# Patient Record
Sex: Female | Born: 1960 | Race: Black or African American | Hispanic: No | Marital: Single | Smoking: Former smoker
Health system: Southern US, Community
[De-identification: ages and names within clinical notes are randomized; demographics above are authoritative.]

## PROBLEM LIST (undated history)

## (undated) DIAGNOSIS — R011 Cardiac murmur, unspecified: Secondary | ICD-10-CM

## (undated) DIAGNOSIS — I00 Rheumatic fever without heart involvement: Secondary | ICD-10-CM

---

## 1998-01-29 ENCOUNTER — Emergency Department (HOSPITAL_COMMUNITY): Admission: EM | Admit: 1998-01-29 | Discharge: 1998-01-29 | Payer: Self-pay

## 1998-10-18 ENCOUNTER — Emergency Department (HOSPITAL_COMMUNITY): Admission: EM | Admit: 1998-10-18 | Discharge: 1998-10-18 | Payer: Self-pay | Admitting: Emergency Medicine

## 1998-10-18 ENCOUNTER — Encounter: Payer: Self-pay | Admitting: Emergency Medicine

## 2002-02-13 ENCOUNTER — Emergency Department (HOSPITAL_COMMUNITY): Admission: EM | Admit: 2002-02-13 | Discharge: 2002-02-13 | Payer: Self-pay | Admitting: Emergency Medicine

## 2003-09-29 ENCOUNTER — Emergency Department (HOSPITAL_COMMUNITY): Admission: EM | Admit: 2003-09-29 | Discharge: 2003-09-29 | Payer: Self-pay | Admitting: Emergency Medicine

## 2003-10-08 ENCOUNTER — Emergency Department (HOSPITAL_COMMUNITY): Admission: EM | Admit: 2003-10-08 | Discharge: 2003-10-08 | Payer: Self-pay | Admitting: Emergency Medicine

## 2003-10-25 ENCOUNTER — Encounter: Admission: RE | Admit: 2003-10-25 | Discharge: 2003-11-29 | Payer: Self-pay | Admitting: Family Medicine

## 2008-09-17 ENCOUNTER — Emergency Department (HOSPITAL_COMMUNITY): Admission: EM | Admit: 2008-09-17 | Discharge: 2008-09-18 | Payer: Self-pay | Admitting: Emergency Medicine

## 2011-09-04 ENCOUNTER — Emergency Department (HOSPITAL_COMMUNITY)
Admission: EM | Admit: 2011-09-04 | Discharge: 2011-09-05 | Disposition: A | Discharge status: Home / Self Care | Payer: Self-pay | Attending: Emergency Medicine | Admitting: Emergency Medicine

## 2011-09-04 ENCOUNTER — Encounter (HOSPITAL_COMMUNITY): Payer: Self-pay | Admitting: Emergency Medicine

## 2011-09-04 DIAGNOSIS — R350 Frequency of micturition: Secondary | ICD-10-CM | POA: Insufficient documentation

## 2011-09-04 DIAGNOSIS — Z8619 Personal history of other infectious and parasitic diseases: Secondary | ICD-10-CM | POA: Insufficient documentation

## 2011-09-04 DIAGNOSIS — R109 Unspecified abdominal pain: Secondary | ICD-10-CM | POA: Insufficient documentation

## 2011-09-04 DIAGNOSIS — R011 Cardiac murmur, unspecified: Secondary | ICD-10-CM | POA: Insufficient documentation

## 2011-09-04 DIAGNOSIS — Z842 Family history of other diseases of the genitourinary system: Secondary | ICD-10-CM | POA: Insufficient documentation

## 2011-09-04 HISTORY — DX: Cardiac murmur, unspecified: R01.1

## 2011-09-04 HISTORY — DX: Rheumatic fever without heart involvement: I00

## 2011-09-04 LAB — URINE MICROSCOPIC-ADD ON

## 2011-09-04 LAB — URINALYSIS, ROUTINE W REFLEX MICROSCOPIC
Bilirubin Urine: NEGATIVE
Glucose, UA: NEGATIVE mg/dL
Ketones, ur: NEGATIVE mg/dL
Nitrite: NEGATIVE
Protein, ur: NEGATIVE mg/dL
Specific Gravity, Urine: 1.009 (ref 1.005–1.030)
Urobilinogen, UA: 0.2 mg/dL (ref 0.0–1.0)
pH: 6.5 (ref 5.0–8.0)

## 2011-09-04 NOTE — ED Notes (Signed)
Pt states she is having pain in her left flank area  Pt states pain started suddenly and is a nagging throbbing pain

## 2011-09-04 NOTE — ED Provider Notes (Signed)
History     CSN: [id number]  Arrival date & time 09/04/11  2201   First MD Initiated Contact with Patient 09/04/11 2241      Chief Complaint  Patient presents with  . Flank Pain     HPI  History provided by the patient. Patient is a 51 year old female with no significant past medical history who presents with complaints of acute left flank pain around 7 PM tonight. Symptoms are described as a sharp aching pain without radiation. Pain is moderate to severe at times. Patient gradually has improved and has since resolved completely after arriving to emergency room. Patient does report a history of being on a strict diet that includes drinking a special formulated Tea. patient reports drinking this tea heavily for the past several years. After the onset of her symptoms patient did drink a whole bottle of cranberry juice. She thinks that this helped with her symptoms. Patient has not had any recent dysuria or urinary frequency. She does report having urinary frequency today after drinking all the fluids. Patient denies having any associated fever, chills, sweats, nausea or vomiting. Denies any diarrhea or constipation. She denies any vaginal bleeding or vaginal discharge. Patient denies having similar symptoms previously. No history of kidney stones.    Past Medical History  Diagnosis Date  . Heart murmur   . Rheumatic fever     History reviewed. No pertinent past surgical history.  Family History  Problem Relation Age of Onset  . Kidney disease Mother     History  Substance Use Topics  . Smoking status: Never Smoker   . Smokeless tobacco: Not on file  . Alcohol Use: No    OB History    Grav Para Term Preterm Abortions TAB SAB Ect Mult Living                  Review of Systems  Constitutional: Negative for fever and chills.  Respiratory: Negative for cough and shortness of breath.   Cardiovascular: Negative for chest pain.  Gastrointestinal: Negative for nausea, vomiting,  abdominal pain, diarrhea and constipation.  Genitourinary: Positive for frequency and flank pain. Negative for dysuria, hematuria, vaginal bleeding and vaginal discharge.  All other systems reviewed and are negative.    Allergies  Review of patient's allergies indicates no known allergies.  Home Medications  No current outpatient prescriptions on file.  BP 125/85  Pulse 94  Temp(Src) 97.8 F (36.6 C) (Oral)  Resp 20  SpO2 100%  LMP 08/28/2011  Physical Exam  Nursing note and vitals reviewed. Constitutional: She is oriented to person, place, and time. She appears well-developed and well-nourished. No distress.  HENT:  Head: Normocephalic.  Cardiovascular: Normal rate and regular rhythm.   Pulmonary/Chest: Effort normal and breath sounds normal.  Abdominal: Soft. There is no rigidity, no rebound, no guarding, no CVA tenderness and no tenderness at McBurney's point.  Neurological: She is alert and oriented to person, place, and time.  Skin: Skin is warm and dry. No rash noted.  Psychiatric: She has a normal mood and affect. Her behavior is normal.    ED Course  Procedures   Results for orders placed during the hospital encounter of 09/04/11  URINALYSIS, ROUTINE W REFLEX MICROSCOPIC      Component Value Range   Color, Urine YELLOW  YELLOW    APPearance CLEAR  CLEAR    Specific Gravity, Urine 1.009  1.005 - 1.030    pH 6.5  5.0 - 8.0  Glucose, UA NEGATIVE  NEGATIVE (mg/dL)   Hgb urine dipstick LARGE (*) NEGATIVE    Bilirubin Urine NEGATIVE  NEGATIVE    Ketones, ur NEGATIVE  NEGATIVE (mg/dL)   Protein, ur NEGATIVE  NEGATIVE (mg/dL)   Urobilinogen, UA 0.2  0.0 - 1.0 (mg/dL)   Nitrite NEGATIVE  NEGATIVE    Leukocytes, UA SMALL (*) NEGATIVE   URINE MICROSCOPIC-ADD ON      Component Value Range   Squamous Epithelial / LPF RARE  RARE    WBC, UA 0-2  <3 (WBC/hpf)   RBC / HPF 0-2  <3 (RBC/hpf)   Bacteria, UA RARE  RARE         1. Flank pain       MDM  10:50PM  patient seen and evaluated. Patient in no acute distress. Patient currently without symptoms.  Patient reports that she is very afraid of needles and would not like to have a needle tests if possible.  Patient continues to be symptom-free. Urine without any significant findings. Dipstick does show large hemoglobin but very few on microscopic. At this time patient will be discharged home with continued observation of symptoms.      Angus Seller, [place] 09/05/11 [phone]

## 2011-09-05 ENCOUNTER — Encounter (HOSPITAL_COMMUNITY): Payer: Self-pay | Admitting: Emergency Medicine

## 2011-09-05 ENCOUNTER — Emergency Department (HOSPITAL_COMMUNITY)
Admission: EM | Admit: 2011-09-05 | Discharge: 2011-09-05 | Disposition: A | Discharge status: Home / Self Care | Payer: Self-pay | Attending: Emergency Medicine | Admitting: Emergency Medicine

## 2011-09-05 ENCOUNTER — Emergency Department (HOSPITAL_COMMUNITY): Payer: Self-pay

## 2011-09-05 DIAGNOSIS — E669 Obesity, unspecified: Secondary | ICD-10-CM | POA: Insufficient documentation

## 2011-09-05 DIAGNOSIS — R10819 Abdominal tenderness, unspecified site: Secondary | ICD-10-CM | POA: Insufficient documentation

## 2011-09-05 DIAGNOSIS — R3129 Other microscopic hematuria: Secondary | ICD-10-CM | POA: Insufficient documentation

## 2011-09-05 DIAGNOSIS — R109 Unspecified abdominal pain: Secondary | ICD-10-CM | POA: Insufficient documentation

## 2011-09-05 LAB — CBC
HCT: 33.7 % — ABNORMAL LOW (ref 36.0–46.0)
Hemoglobin: 10.9 g/dL — ABNORMAL LOW (ref 12.0–15.0)
MCH: 29.2 pg (ref 26.0–34.0)
MCHC: 32.3 g/dL (ref 30.0–36.0)
MCV: 90.3 fL (ref 78.0–100.0)
Platelets: 239 10*3/uL (ref 150–400)
RBC: 3.73 MIL/uL — ABNORMAL LOW (ref 3.87–5.11)
RDW: 14.1 % (ref 11.5–15.5)
WBC: 4.6 10*3/uL (ref 4.0–10.5)

## 2011-09-05 LAB — BASIC METABOLIC PANEL
BUN: 11 mg/dL (ref 6–23)
Chloride: 104 mEq/L (ref 96–112)
Creatinine, Ser: 0.63 mg/dL (ref 0.50–1.10)
GFR calc non Af Amer: >90 mL/min (ref >90)
Glucose, Bld: 104 mg/dL — ABNORMAL HIGH (ref 70–99)
Potassium: 4.5 mEq/L (ref 3.5–5.1)

## 2011-09-05 LAB — DIFFERENTIAL
Basophils Relative: 0 % (ref 0–1)
Eosinophils Absolute: 0 10*3/uL (ref 0.0–0.7)
Lymphs Abs: 1.2 10*3/uL (ref 0.7–4.0)
Monocytes Absolute: 0.4 10*3/uL (ref 0.1–1.0)
Monocytes Relative: 8 % (ref 3–12)
Neutrophils Relative %: 64 % (ref 43–77)

## 2011-09-05 MED ORDER — HYDROCODONE-ACETAMINOPHEN 5-325 MG PO TABS
1.0000 | ORAL_TABLET | Freq: Once | ORAL | Status: AC
  Administered 2011-09-05: 1 via ORAL
  Filled 2011-09-05: qty 1

## 2011-09-05 MED ORDER — HYDROCODONE-ACETAMINOPHEN 5-325 MG PO TABS: 1.0000 | ORAL_TABLET | ORAL | Status: AC | PRN

## 2011-09-05 NOTE — ED Provider Notes (Signed)
Medical screening examination/treatment/procedure(s) were performed by non-physician practitioner and as supervising physician I was immediately available for consultation/collaboration.  Flint Melter, MD 09/05/11 0800

## 2011-09-05 NOTE — Discharge Instructions (Signed)
You were seen again for your symptoms of left flank pain. Your lab tests and CAT scan today have not shown any signs for concerning or emergent causes to explain your symptoms.  There were no signs for kidney stones. At this time your providers feel you're able to return home and followup with a primary care provider for continued evaluation and treatment of your symptoms.   Flank Pain Flank pain refers to pain that is located on the side of the body between the upper abdomen and the back. It can be caused by many things. CAUSES  Some of the more common causes of flank pain include:  Muscle strain.   Muscle spasms.   A disease of your spine (vertebral disk disease).   A lung infection (pneumonia).   Fluid around your lungs (pulmonary edema).   A kidney infection.   Kidney stones.   A very painful skin rash on only one side of your body (shingles).   Gallbladder disease.  DIAGNOSIS  Blood tests, urine tests, and X-rays may help your caregiver determine what is wrong. TREATMENT  The treatment of pain depends on the cause. Your caregiver will determine what treatment will work best for you. HOME CARE INSTRUCTIONS   Home care will depend on the cause of your pain.   Some medications may help relieve the pain. Take medication for relief of pain as directed by your caregiver.   Tell your caregiver about any changes in your pain.   Follow up with your caregiver.  SEEK IMMEDIATE MEDICAL CARE IF:   Your pain is not controlled with medication.   The pain increases.   You have abdominal pain.   You have shortness of breath.   You have persistent nausea or vomiting.   You have swelling in your abdomen.   You feel faint or pass out.   You have a temperature by mouth above 102 F (38.9 C), not controlled by medicine.  MAKE SURE YOU:   Understand these instructions.   Will watch your condition.   Will get help right away if you are not doing well or get worse.    Document Released: 08/08/2005 Document Revised: 06/06/2011 Document Reviewed: 12/02/2009 [facility] Patient Information 2012 [place], [place].   RESOURCE GUIDE  Dental Problems  Patients with Medicaid: [facility] [phone] [address]                                           [phone] [address] Phone:  [phone]                                                  Phone:  [phone]  If unable to pay or uninsured, contact:  [facility] or [facility]. to become qualified for the adult dental clinic.  Chronic Pain Problems Contact [facility]  [phone] Patients need to be referred by their primary care doctor.  Insufficient Money for Medicine Contact United Way:  call "211" or [facility] [phone].  No Primary Care Doctor Call [facility]  [phone] Other agencies that provide  inexpensive medical care    [facility]  [phone]    [facility]  [phone]    [facility]  [phone]    [facility]  [phone]    [facility]  [phone]    [facility]  [phone]  Psychological Services [facility]  [phone] [facility]  [phone] [facility] Mental Health   [phone] (emergency services [phone])  Substance Abuse Resources [facility] and Drug Services  [phone] [facility] [phone] [facility] [place] [phone] Floydene Flock [phone] Residential & Outpatient Substance Abuse Program  [phone]  Abuse/Neglect [facility] Child Abuse Hotline 470-324-7561 [facility] Child Abuse Hotline [phone] (After Hours)  Emergency Shelter [facility] Ministries [phone]  Maternity Homes Room at the [place] [facility] [place] [phone] [facility] [phone]  MRSA Hotline #:   8017776582    [facility] Resources  [facility] [place]     United Way                          [facility] Dept. [address] [place]                       [address]      [address]  Blondell Reveal Phone:  [phone]                                   Phone:  [phone]                 Phone:  [phone]  [facility] Mental Health Phone:  [phone]  [facility] Child Abuse Hotline 5622386918 [phone] (After Hours)

## 2011-09-05 NOTE — ED Provider Notes (Signed)
History     CSN: [id number]  Arrival date & time 09/05/11  4098   First MD Initiated Contact with Patient 09/05/11 [phone]      Chief Complaint  Patient presents with  . Flank Pain     HPI  History provided by the patient. Patient is a 51 year old female with no significant past medical history who returns for reexamination of left flank pain and symptoms. Patient was seen last evening for similar complaints but was asymptomatic during emergency room visit. She did have a urine with hematuria. Patient currently states that left flank pains returned early this morning. Pain is a sharp stabbing pain in left flank. Pain is persistent at this time and continues. It is not associated with any other symptoms. She denies fever, chills, sweats, nausea or vomiting. Patient denies dysuria, hematuria or urinary frequency. She denies any vaginal bleeding or vaginal discharge. Symptoms are described as moderate to severe.    Past Medical History  Diagnosis Date  . Heart murmur   . Rheumatic fever     History reviewed. No pertinent past surgical history.  Family History  Problem Relation Age of Onset  . Kidney disease Mother     History  Substance Use Topics  . Smoking status: Never Smoker   . Smokeless tobacco: Not on file  . Alcohol Use: No    OB History    Grav Para Term Preterm Abortions TAB SAB Ect Mult Living                  Review of Systems  Constitutional: Negative for fever and chills.  Gastrointestinal: Negative for nausea, vomiting, abdominal pain, diarrhea and constipation.  Genitourinary: Positive for flank pain. Negative for dysuria, hematuria, vaginal bleeding and vaginal discharge.  All other systems reviewed and are negative.    Allergies  Review of patient's allergies indicates no known allergies.  Home Medications  No current outpatient prescriptions on file.  BP 103/63  Pulse 78  Temp(Src) 98.3 F (36.8 C) (Oral)  Resp 18  SpO2 100%  LMP  08/28/2011  Physical Exam  Nursing note and vitals reviewed. Constitutional: She is oriented to person, place, and time. She appears well-developed and well-nourished. No distress.  HENT:  Head: Normocephalic and atraumatic.  Cardiovascular: Normal rate and regular rhythm.   Pulmonary/Chest: Effort normal and breath sounds normal. No respiratory distress. She has no wheezes. She has no rales.  Abdominal: Soft. She exhibits no distension. There is no tenderness. There is no rebound and no guarding.       Left CVA tenderness. Obese  Neurological: She is alert and oriented to person, place, and time.  Skin: Skin is warm and dry. No rash noted.  Psychiatric: She has a normal mood and affect. Her behavior is normal.    ED Course  Procedures   Results for orders placed during the hospital encounter of 09/05/11  CBC      Component Value Range   WBC 4.6  4.0 - 10.5 (K/uL)   RBC 3.73 (*) 3.87 - 5.11 (MIL/uL)   Hemoglobin 10.9 (*) 12.0 - 15.0 (g/dL)   HCT 47.8 (*) 29.5 - 46.0 (%)   MCV 90.3  78.0 - 100.0 (fL)   MCH 29.2  26.0 - 34.0 (pg)   MCHC 32.3  30.0 - 36.0 (g/dL)   RDW 62.1  30.8 - 65.7 (%)   Platelets 239  150 - 400 (K/uL)  DIFFERENTIAL      Component Value Range  Neutrophils Relative 64  43 - 77 (%)   Neutro Abs 2.9  1.7 - 7.7 (K/uL)   Lymphocytes Relative 26  12 - 46 (%)   Lymphs Abs 1.2  0.7 - 4.0 (K/uL)   Monocytes Relative 8  3 - 12 (%)   Monocytes Absolute 0.4  0.1 - 1.0 (K/uL)   Eosinophils Relative 1  0 - 5 (%)   Eosinophils Absolute 0.0  0.0 - 0.7 (K/uL)   Basophils Relative 0  0 - 1 (%)   Basophils Absolute 0.0  0.0 - 0.1 (K/uL)      Ct Abdomen Pelvis Wo Contrast  09/05/2011  *RADIOLOGY REPORT*  Clinical Data: Left flank pain, microscopic hematuria, leukouria  CT ABDOMEN AND PELVIS WITHOUT CONTRAST  Technique:  Multidetector CT imaging of the abdomen and pelvis was performed following the standard protocol without intravenous contrast. Sagittal and coronal MPR  images reconstructed from axial data set.  Comparison: None  Findings: Lung bases clear. No urinary tract calcification, hydronephrosis or ureteral dilatation. Within limits of a nonenhanced exam, no focal abnormalities of the liver, spleen, pancreas, kidneys, or adrenal glands identified. Supraumbilical ventral hernia, fascial defect 1.9 x 1.3 cm, hernia sac containing fat. Additional tiny umbilical hernia containing fat. Normal appendix.  Small amount of low attenuation nonspecific free pelvic fluid. Uterus prominent size. Bladder partially distended, unremarkable. Stomach and bowel loops grossly normal appearance. No mass, adenopathy, or free air. No acute osseous findings.  IMPRESSION: Supraumbilical and tiny umbilical hernias containing fat. Small amount nonspecific low attenuation free pelvic fluid. No other intrapelvic or intra abdominal abnormalities identified.  Original Report Authenticated By: Lollie Marrow, M.D.     1. Flank pain       MDM  4:50 AM patient seen and evaluated. Patient in no acute distress. Patient was seen by me last evening for similar complaint. Patient was asymptomatic during her earlier visit and following her urine specimen patient did not wish to have further testing at that time to see if symptoms returned or resolved completely.  Patient now returns with more persistent and intense left flank pains. Although she does not like needles she has agreed to have her blood drawn and tested. We will also perform a CT scan to rule out left kidney stone.        Angus Seller, [place] 09/05/11 [phone]

## 2011-09-05 NOTE — Discharge Instructions (Signed)
Your urine test today has not shown any concerning signs to explain your symptoms. At this time your providers feel you may return home and continue to monitor your symptoms. You should call your primary care provider for close followup appointment. If you develop return of your symptoms or any new concerning symptoms please return to emergency room for additional evaluation and treatment.   Flank Pain Flank pain refers to pain that is located on the side of the body between the upper abdomen and the back. It can be caused by many things. CAUSES  Some of the more common causes of flank pain include:  Muscle strain.   Muscle spasms.   A disease of your spine (vertebral disk disease).   A lung infection (pneumonia).   Fluid around your lungs (pulmonary edema).   A kidney infection.   Kidney stones.   A very painful skin rash on only one side of your body (shingles).   Gallbladder disease.  DIAGNOSIS  Blood tests, urine tests, and X-rays may help your caregiver determine what is wrong. TREATMENT  The treatment of pain depends on the cause. Your caregiver will determine what treatment will work best for you. HOME CARE INSTRUCTIONS   Home care will depend on the cause of your pain.   Some medications may help relieve the pain. Take medication for relief of pain as directed by your caregiver.   Tell your caregiver about any changes in your pain.   Follow up with your caregiver.  SEEK IMMEDIATE MEDICAL CARE IF:   Your pain is not controlled with medication.   The pain increases.   You have abdominal pain.   You have shortness of breath.   You have persistent nausea or vomiting.   You have swelling in your abdomen.   You feel faint or pass out.   You have a temperature by mouth above 102 F (38.9 C), not controlled by medicine.  MAKE SURE YOU:   Understand these instructions.   Will watch your condition.   Will get help right away if you are not doing well or get  worse.  Document Released: 08/08/2005 Document Revised: 06/06/2011 Document Reviewed: 12/02/2009 [facility] Patient Information 2012 [place], [place].    RESOURCE GUIDE  Dental Problems  Patients with Medicaid: [facility] [phone] [address]                                           [phone] [address] Phone:  [phone]                                                  Phone:  [phone]  If unable to pay or uninsured, contact:  [facility] or [facility]. to become qualified for the adult dental clinic.  Chronic Pain Problems Contact [facility]  [phone] Patients need to be referred by their primary care doctor.  Insufficient Money for Medicine Contact United Way:  call "211" or [facility] [phone].  No Primary Care Doctor Call [facility]  [phone] Other agencies that provide inexpensive medical care  [facility]  [phone]    [facility] Internal Medicine  [phone]    [facility]  [phone]    [facility]  [phone]    [facility]anned Parenthood  7376146774    [facility] Child Clinic  [phone]  Psychological Services [facility] Behavioral Health  [phone] [facility]  [phone] [facility] Mental Health   [phone] (emergency services [phone]) [phone])  Substance Abuse Resources Alcohol and Drug Services  [phone] [facility] 843-873-7016 [facility] [place] [phone] Floydene Flock [phone] Residential & Outpatient Substance Abuse Program  762-295-6795  Abuse/Neglect [facility] Child Abuse Hotline 860-631-9862 [facility] Child Abuse Hotline (614) 639-5835 (After Hours)  Emergency Shelter [facility] Ministries (607)072-2851  Maternity Homes Room at the [place] [facility] [place] 615-233-2059 [facility] (786)133-8230  MRSA Hotline #:   (615)108-0922    [facility] Resources  [facility]  [place]     United Way                          [facility] Dept. [address] [place]                       [address]      [address]  Blondell Reveal Phone:  [phone]                                   Phone:  [phone]                 Phone:  [phone]  [facility] Mental Health Phone:  [phone]  [facility] Child Abuse Hotline 661-187-3356 2405121796 (After Hours)

## 2011-09-05 NOTE — ED Provider Notes (Signed)
Medical screening examination/treatment/procedure(s) were performed by non-physician practitioner and as supervising physician I was immediately available for consultation/collaboration.   Glynn Octave, MD 09/05/11 1444

## 2011-09-05 NOTE — ED Notes (Signed)
Pt presents with left flank pain  Pt was seen here earlier for same  Pt states once she got home and went to bed the pain returned and has not let up  Pt had a UA done when she was here earlier

## 2013-09-21 ENCOUNTER — Emergency Department (HOSPITAL_COMMUNITY): Payer: Self-pay

## 2013-09-21 ENCOUNTER — Encounter (HOSPITAL_COMMUNITY): Payer: Self-pay | Admitting: Emergency Medicine

## 2013-09-21 ENCOUNTER — Emergency Department (HOSPITAL_COMMUNITY)
Admission: EM | Admit: 2013-09-21 | Discharge: 2013-09-21 | Disposition: A | Discharge status: Home / Self Care | Payer: Self-pay | Attending: Emergency Medicine | Admitting: Emergency Medicine

## 2013-09-21 DIAGNOSIS — L02419 Cutaneous abscess of limb, unspecified: Secondary | ICD-10-CM | POA: Insufficient documentation

## 2013-09-21 DIAGNOSIS — Y939 Activity, unspecified: Secondary | ICD-10-CM | POA: Insufficient documentation

## 2013-09-21 DIAGNOSIS — R079 Chest pain, unspecified: Secondary | ICD-10-CM | POA: Insufficient documentation

## 2013-09-21 DIAGNOSIS — Z8679 Personal history of other diseases of the circulatory system: Secondary | ICD-10-CM | POA: Insufficient documentation

## 2013-09-21 DIAGNOSIS — R0602 Shortness of breath: Secondary | ICD-10-CM | POA: Insufficient documentation

## 2013-09-21 DIAGNOSIS — Z7982 Long term (current) use of aspirin: Secondary | ICD-10-CM | POA: Insufficient documentation

## 2013-09-21 DIAGNOSIS — L03119 Cellulitis of unspecified part of limb: Principal | ICD-10-CM

## 2013-09-21 DIAGNOSIS — Y929 Unspecified place or not applicable: Secondary | ICD-10-CM | POA: Insufficient documentation

## 2013-09-21 DIAGNOSIS — R002 Palpitations: Secondary | ICD-10-CM | POA: Insufficient documentation

## 2013-09-21 DIAGNOSIS — L0390 Cellulitis, unspecified: Secondary | ICD-10-CM

## 2013-09-21 DIAGNOSIS — W57XXXA Bitten or stung by nonvenomous insect and other nonvenomous arthropods, initial encounter: Secondary | ICD-10-CM

## 2013-09-21 DIAGNOSIS — R011 Cardiac murmur, unspecified: Secondary | ICD-10-CM | POA: Insufficient documentation

## 2013-09-21 LAB — CBC
HCT: 37.5 % (ref 36.0–46.0)
Hemoglobin: 12.8 g/dL (ref 12.0–15.0)
MCH: 31.2 pg (ref 26.0–34.0)
MCHC: 34.1 g/dL (ref 30.0–36.0)
MCV: 91.5 fL (ref 78.0–100.0)
PLATELETS: 214 10*3/uL (ref 150–400)
RBC: 4.1 MIL/uL (ref 3.87–5.11)
RDW: 13.8 % (ref 11.5–15.5)
WBC: 3.3 10*3/uL — AB (ref 4.0–10.5)

## 2013-09-21 LAB — I-STAT TROPONIN, ED: TROPONIN I, POC: 0 ng/mL (ref 0.00–0.08)

## 2013-09-21 LAB — BASIC METABOLIC PANEL
BUN: 16 mg/dL (ref 6–23)
CHLORIDE: 101 meq/L (ref 96–112)
CO2: 26 meq/L (ref 19–32)
CREATININE: 0.58 mg/dL (ref 0.50–1.10)
Calcium: 9.6 mg/dL (ref 8.4–10.5)
GFR calc Af Amer: >90 mL/min (ref >90)
GFR calc non Af Amer: >90 mL/min (ref >90)
Glucose, Bld: 83 mg/dL (ref 70–99)
POTASSIUM: 4.5 meq/L (ref 3.7–5.3)
SODIUM: 139 meq/L (ref 137–147)

## 2013-09-21 LAB — PRO B NATRIURETIC PEPTIDE: PRO B NATRI PEPTIDE: 134.3 pg/mL — AB (ref 0–125)

## 2013-09-21 LAB — D-DIMER, QUANTITATIVE (NOT AT ARMC): D DIMER QUANT: 1.28 ug{FEU}/mL — AB (ref 0.00–0.48)

## 2013-09-21 MED ORDER — CEPHALEXIN 500 MG PO CAPS: 500.0000 mg | ORAL_CAPSULE | Freq: Four times a day (QID) | ORAL | Status: DC

## 2013-09-21 MED ORDER — CEPHALEXIN 250 MG PO CAPS
500.0000 mg | ORAL_CAPSULE | Freq: Once | ORAL | Status: AC
Start: 2013-09-21 — End: 2013-09-21
  Administered 2013-09-21: 500 mg via ORAL
  Filled 2013-09-21: qty 2

## 2013-09-21 NOTE — Discharge Instructions (Signed)
Cellulitis Cellulitis is an infection of the skin and the tissue beneath it. The infected area is usually red and tender. Cellulitis occurs most often in the arms and lower legs.  CAUSES  Cellulitis is caused by bacteria that enter the skin through cracks or cuts in the skin. The most common types of bacteria that cause cellulitis are Staphylococcus and Streptococcus. SYMPTOMS   Redness and warmth.  Swelling.  Tenderness or pain.  Fever. DIAGNOSIS  Your caregiver can usually determine what is wrong based on a physical exam. Blood tests may also be done. TREATMENT  Treatment usually involves taking an antibiotic medicine. HOME CARE INSTRUCTIONS   Take your antibiotics as directed. Finish them even if you start to feel better.  Keep the infected arm or leg elevated to reduce swelling.  Apply a warm cloth to the affected area up to 4 times per day to relieve pain.  Only take over-the-counter or prescription medicines for pain, discomfort, or fever as directed by your caregiver.  Keep all follow-up appointments as directed by your caregiver. SEEK MEDICAL CARE IF:   You notice red streaks coming from the infected area.  Your red area gets larger or turns dark in color.  Your bone or joint underneath the infected area becomes painful after the skin has healed.  Your infection returns in the same area or another area.  You notice a swollen bump in the infected area.  You develop new symptoms. SEEK IMMEDIATE MEDICAL CARE IF:   You have a fever.  You feel very sleepy.  You develop vomiting or diarrhea.  You have a general ill feeling (malaise) with muscle aches and pains. MAKE SURE YOU:   Understand these instructions.  Will watch your condition.  Will get help right away if you are not doing well or get worse. Document Released: 03/27/2005 Document Revised: 12/17/2011 Document Reviewed: 09/02/2011 [employer] Patient Information 2014 [employer].  

## 2013-09-21 NOTE — ED Notes (Signed)
Pt concerned about having to be stuck again, sts if the scan is not necessary then she doesn't want to IV or the scan. Spoke with Dr. Fayrene FearingJames about pt's concerns and he will be in shortly to speak with the pt.

## 2013-09-21 NOTE — ED Notes (Signed)
Pt agreed to have IV started for CT Angio Chest.  IV team paged and aware of need for 20g IV.

## 2013-09-21 NOTE — ED Provider Notes (Signed)
CSN: [id number]     Arrival date & time 09/21/13  [id number] History   First MD Initiated Contact with Patient 09/21/13 [phone]     Chief Complaint  Patient presents with  . Shortness of Breath  . Leg Pain      HPI  Patient presents with pain swelling and right leg. She was at her friend's house several days ago states she was bitten by up from the couch. She at home. She showered. She Dulera) become progressively red and swollen this morning showed some chest pain and palpitations lasted for several minutes. She became concerned as read leg discomfort in her chest she presents here. She denies history of DVT, PE, pregnancy, smoking, recent immobilization cast splints fractures or surgeries. No personal or family history DVT or PE.  Past Medical History  Diagnosis Date  . Heart murmur   . Rheumatic fever    History reviewed. No pertinent past surgical history. Family History  Problem Relation Age of Onset  . Kidney disease Mother    History  Substance Use Topics  . Smoking status: Never Smoker   . Smokeless tobacco: Not on file  . Alcohol Use: No   OB History   Grav Para Term Preterm Abortions TAB SAB Ect Mult Living                 Review of Systems  Constitutional: Negative for fever, chills, diaphoresis, appetite change and fatigue.  HENT: Negative for mouth sores, sore throat and trouble swallowing.   Eyes: Negative for visual disturbance.  Respiratory: Negative for cough, chest tightness, shortness of breath and wheezing.   Cardiovascular: Positive for chest pain and palpitations.  Gastrointestinal: Negative for nausea, vomiting, abdominal pain, diarrhea and abdominal distention.  Endocrine: Negative for polydipsia, polyphagia and polyuria.  Genitourinary: Negative for dysuria, frequency and hematuria.  Musculoskeletal: Negative for gait problem.  Skin: Positive for color change and wound. Negative for pallor.  Neurological: Negative for dizziness, syncope, light-headedness  and headaches.  Hematological: Does not bruise/bleed easily.  Psychiatric/Behavioral: Negative for behavioral problems and confusion.      Allergies  Review of patient's allergies indicates no known allergies.  Home Medications   Current Outpatient Rx  Name  Route  Sig  Dispense  Refill  . aspirin 81 MG tablet   Oral   Take 162 mg by mouth daily as needed for pain.         Marland Kitchen. Hydrocortisone-Aloe Vera (CORTIZONE 10 PLUS) 1 % CREA   Apply externally   Apply 1 application topically 3 (three) times daily as needed (itching). Apply to right calf and right ankle area as needed for itching         . cephALEXin (KEFLEX) 500 MG capsule   Oral   Take 1 capsule (500 mg total) by mouth 4 (four) times daily.   40 capsule   0    BP 113/52  Pulse 89  Temp(Src) 98 F (36.7 C) (Oral)  Resp 14  Wt 214 lb (97.07 kg)  SpO2 95%  LMP 09/21/2013 Physical Exam  Constitutional: She is oriented to person, place, and time. She appears well-developed and well-nourished. No distress.  HENT:  Head: Normocephalic.  Eyes: Conjunctivae are normal. Pupils are equal, round, and reactive to light. No scleral icterus.  Neck: Normal range of motion. Neck supple. No thyromegaly present.  Cardiovascular: Normal rate and regular rhythm.  Exam reveals no gallop and no friction rub.   No murmur heard. Pulmonary/Chest: Effort normal and  breath sounds normal. No respiratory distress. She has no wheezes. She has no rales.  Abdominal: Soft. Bowel sounds are normal. She exhibits no distension. There is no tenderness. There is no rebound.  Musculoskeletal: Normal range of motion.  Neurological: She is alert and oriented to person, place, and time.  Skin: Skin is warm and dry. No rash noted.     Psychiatric: She has a normal mood and affect. Her behavior is normal.    ED Course  Procedures (including critical care time) Labs Review Labs Reviewed  CBC - Abnormal; Notable for the following:    WBC 3.3 (*)     All other components within normal limits  PRO B NATRIURETIC PEPTIDE - Abnormal; Notable for the following:    Pro B Natriuretic peptide (BNP) 134.3 (*)    All other components within normal limits  D-DIMER, QUANTITATIVE - Abnormal; Notable for the following:    D-Dimer, Quant 1.28 (*)    All other components within normal limits  BASIC METABOLIC PANEL  Rosezena Sensor, ED   Imaging Review Dg Chest 2 View  09/21/2013   CLINICAL DATA:  Shortness of Breath  EXAM: CHEST  2 VIEW  COMPARISON:  None.  FINDINGS: Cardiomediastinal silhouette is unremarkable. No acute infiltrate or pleural effusion. No pulmonary edema. Mild degenerative changes thoracic spine.  IMPRESSION: No active cardiopulmonary disease.   Electronically Signed   By: Natasha Mead M.D.   On: 09/21/2013 09:49     EKG Interpretation   Date/Time:  Tuesday September 21 2013 09:09:55 EDT Ventricular Rate:  62 PR Interval:  172 QRS Duration: 82 QT Interval:  410 QTC Calculation: 416 R Axis:   14 Text Interpretation:  Normal sinus rhythm Normal ECG Confirmed by Fayrene Fearing   MD, Cadi Rhinehart ([id number]) on 09/21/2013 10:59:03 AM      MDM   Final diagnoses:  Cellulitis  Insect bite    Has elevated d-dimer. Has a negative Doppler. After multiple attempts were unable to obtain 20-gauge a.c. IV access. I discussed this with her at length. She is able to her in the emergency room. She's not tachycardic. She's not hypoxemic. Her palpitations and discomfort in her chest was brief and has not recurred. She will allow further IV attempts. Comfortable with her going home with instructions to return with any recurrence of symptoms. With be treatment for her bite cellulitis with Keflex.    Rolland Porter, MD 09/21/13 [phone]

## 2013-09-21 NOTE — Progress Notes (Signed)
VASCULAR LAB PRELIMINARY  PRELIMINARY  PRELIMINARY  PRELIMINARY  Right lower extremity venous duplex completed.    Preliminary report:  Right:  No evidence of DVT, superficial thrombosis, or Baker's cyst.  Ameri Cahoon, RVT 09/21/2013, 12:03 PM

## 2013-09-21 NOTE — ED Notes (Signed)
Pt reports having an insect bite to back of right lower leg, had swelling and pain to her leg. Had pain to chest, palpitations and sob that started last night when she was lying down. Airway intact.

## 2013-10-26 ENCOUNTER — Encounter (HOSPITAL_COMMUNITY): Payer: Self-pay | Admitting: Emergency Medicine

## 2013-10-26 ENCOUNTER — Emergency Department (HOSPITAL_COMMUNITY)
Admission: EM | Admit: 2013-10-26 | Discharge: 2013-10-26 | Disposition: A | Discharge status: Home / Self Care | Payer: Self-pay | Attending: Emergency Medicine | Admitting: Emergency Medicine

## 2013-10-26 DIAGNOSIS — M436 Torticollis: Secondary | ICD-10-CM

## 2013-10-26 DIAGNOSIS — Y939 Activity, unspecified: Secondary | ICD-10-CM | POA: Insufficient documentation

## 2013-10-26 DIAGNOSIS — S0993XA Unspecified injury of face, initial encounter: Secondary | ICD-10-CM | POA: Insufficient documentation

## 2013-10-26 DIAGNOSIS — Z87891 Personal history of nicotine dependence: Secondary | ICD-10-CM | POA: Insufficient documentation

## 2013-10-26 DIAGNOSIS — S0990XA Unspecified injury of head, initial encounter: Secondary | ICD-10-CM | POA: Insufficient documentation

## 2013-10-26 DIAGNOSIS — Y929 Unspecified place or not applicable: Secondary | ICD-10-CM | POA: Insufficient documentation

## 2013-10-26 DIAGNOSIS — Z8679 Personal history of other diseases of the circulatory system: Secondary | ICD-10-CM | POA: Insufficient documentation

## 2013-10-26 DIAGNOSIS — R011 Cardiac murmur, unspecified: Secondary | ICD-10-CM | POA: Insufficient documentation

## 2013-10-26 DIAGNOSIS — R51 Headache: Secondary | ICD-10-CM

## 2013-10-26 DIAGNOSIS — R519 Headache, unspecified: Secondary | ICD-10-CM

## 2013-10-26 DIAGNOSIS — S199XXA Unspecified injury of neck, initial encounter: Secondary | ICD-10-CM

## 2013-10-26 MED ORDER — NAPROXEN 500 MG PO TABS: 500.0000 mg | ORAL_TABLET | Freq: Two times a day (BID) | ORAL | Status: DC

## 2013-10-26 NOTE — ED Notes (Signed)
Pt reports taking a Bayer Aspirin this morning, denies pain during ED triage.

## 2013-10-26 NOTE — ED Notes (Signed)
PA and PA student at bedside  
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
## 2013-10-26 NOTE — ED Provider Notes (Signed)
Medical screening examination/treatment/procedure(s) were performed by non-physician practitioner and as supervising physician I was immediately available for consultation/collaboration.   EKG Interpretation None        Gavin PoundMichael Y. Oletta LamasGhim, MD 10/26/13 [phone]
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
## 2015-12-03 ENCOUNTER — Encounter (HOSPITAL_COMMUNITY): Payer: Self-pay | Admitting: Emergency Medicine

## 2015-12-03 ENCOUNTER — Emergency Department (HOSPITAL_COMMUNITY)
Admission: EM | Admit: 2015-12-03 | Discharge: 2015-12-03 | Disposition: A | Discharge status: Home / Self Care | Payer: No Typology Code available for payment source | Attending: Emergency Medicine | Admitting: Emergency Medicine

## 2015-12-03 DIAGNOSIS — L03115 Cellulitis of right lower limb: Secondary | ICD-10-CM | POA: Insufficient documentation

## 2015-12-03 DIAGNOSIS — Z87891 Personal history of nicotine dependence: Secondary | ICD-10-CM | POA: Insufficient documentation

## 2015-12-03 DIAGNOSIS — R600 Localized edema: Secondary | ICD-10-CM

## 2015-12-03 DIAGNOSIS — Z791 Long term (current) use of non-steroidal anti-inflammatories (NSAID): Secondary | ICD-10-CM | POA: Insufficient documentation

## 2015-12-03 MED ORDER — CEPHALEXIN 500 MG PO CAPS: 500.0000 mg | ORAL_CAPSULE | Freq: Four times a day (QID) | ORAL | Status: DC

## 2015-12-03 MED ORDER — DIPHENHYDRAMINE HCL 25 MG PO TABS: 25.0000 mg | ORAL_TABLET | Freq: Four times a day (QID) | ORAL | Status: DC | PRN

## 2015-12-03 NOTE — ED Provider Notes (Signed)
CSN: [id number]     Arrival date & time 12/03/15  1153 History  By signing my name below, I, [facility]agher, attest that this documentation has been prepared under the direction and in the presence of Elizabeth Taiesha Bovard, PA-C. Electronically Signed: Randell PatientMarrissa Gallagher, ED Scribe. 12/03/2015. 12:44 PM.   Chief Complaint  Gallagher presents with  . Foot Swelling    The history is provided by the Gallagher. No language interpreter was used.   HPI Comments: Elizabeth KosSarah Gallagher is a 55 y.o. female who presents to the Emergency Department complaining of constant, not painful, waxing and waning, right foot swelling onset 2 weeks ago. Pt states that she walks 10 miles a day in the park and felt a sharp stinging pain on the sole of her right foot followed by gradual swelling and pruritis for the past 3 days. She has soaked her feet in vinegar, witch hazel, and Clorox. She notes similar symptoms 2 years ago when she was bitten in the leg by an insect that required treatment with antibiotics. Denies hx of right foot or ankle fractures. Denies elevating the right foot. Denies recent extended travel, surgeries, estrogen use, DVT. Denies calf pain, foot pain, fevers, chills, generalized body aches, CP, SOB.   Past Medical History  Diagnosis Date  . Heart murmur   . Rheumatic fever    History reviewed. No pertinent past surgical history. Family History  Problem Relation Age of Onset  . Kidney disease Mother    Social History  Substance Use Topics  . Smoking status: Former Games developermoker  . Smokeless tobacco: None  . Alcohol Use: No   OB History    No data available     Review of Systems  Constitutional: Negative for fever and chills.  Respiratory: Negative for shortness of breath.   Cardiovascular: Positive for leg swelling (right foot). Negative for chest pain.  Musculoskeletal: Negative for myalgias and arthralgias.  Allergic/Immunologic: Negative for immunocompromised state.  Hematological: Does not bruise/bleed  easily.  Psychiatric/Behavioral: Negative for self-injury.      Allergies  Review of Gallagher's allergies indicates no known allergies.  Home Medications   Prior to Admission medications   Medication Sig Start Date End Date Taking? Authorizing Provider  naproxen (NAPROSYN) 500 MG tablet Take 1 tablet (500 mg total) by mouth 2 (two) times daily with a meal. 10/26/13   Arthor CaptainAbigail Harris, PA-C   BP 118/87 mmHg  Pulse 83  Temp(Src) 97.6 F (36.4 C) (Oral)  Resp 17  SpO2 100%  LMP 08/18/2013 Physical Exam  Constitutional: She appears well-developed and well-nourished. No distress.  HENT:  Head: Normocephalic and atraumatic.  Neck: Neck supple.  Pulmonary/Chest: Effort normal.  Musculoskeletal: She exhibits edema. She exhibits no tenderness.  RLE: Right pedal edema that is diffuse, slight warmth, slight erythema.  Nontender. No calf edema or tenderness. DP pulse intact. Right calf with soft varicosities (unchanged x 20 years per pt)  Neurological: She is alert.  Skin: She is not diaphoretic.  Nursing note and vitals reviewed.   ED Course  Procedures   DIAGNOSTIC STUDIES: Oxygen Saturation is 100% on RA, normal by my interpretation.    COORDINATION OF CARE: 12:23 PM Discussed treatment plan with pt at bedside and pt agreed to plan.  12:42 PM Returned to re-examine foot. Will prescribe antibiotics. Advised pt to rest. Ice, and elevate foot at home. Advised pt to return to the ED or follow-up with PCP if no improvement in 3 days.   MDM   Final diagnoses:  Edema of right foot  Cellulitis of right foot    Afebrile, nontoxic Gallagher with right foot swelling following some sort of stinging sensation while walking two weeks ago.  Foot is slightly warm, slightly erythematous, edematous.  Nontender.  Suspect mild foot cellulitis vs localized reaction to insect bite.  Doubt DVT.  No CP,SOB, DOE or concern for heart failure as cause.   D/C home with keflex, benadryl, home care  instructions, recheck in 3 days.  Discussed result, findings, treatment, and follow up  with Gallagher.  Pt given return precautions.  Pt verbalizes understanding and agrees with plan.        I personally performed the services described in this documentation, which was scribed in my presence. The recorded information has been reviewed and is accurate.   Trixie Dredge, PA-C 12/03/15 1316  Derwood Kaplan, MD 12/03/15 1610

## 2015-12-03 NOTE — Discharge Instructions (Signed)
Read the information below.  Use the prescribed medication as directed.  Please discuss all new medications with your pharmacist.  You may return to the Emergency Department at any time for worsening condition or any new symptoms that concern you.  If you develop uncontrolled pain, weakness or numbness of the extremity, severe discoloration of the skin, or you are unable to walk, return to the ER for a recheck.      Cellulitis Cellulitis is an infection of the skin and the tissue beneath it. The infected area is usually red and tender. Cellulitis occurs most often in the arms and lower legs.  CAUSES  Cellulitis is caused by bacteria that enter the skin through cracks or cuts in the skin. The most common types of bacteria that cause cellulitis are staphylococci and streptococci. SIGNS AND SYMPTOMS   Redness and warmth.  Swelling.  Tenderness or pain.  Fever. DIAGNOSIS  Your health care provider can usually determine what is wrong based on a physical exam. Blood tests may also be done. TREATMENT  Treatment usually involves taking an antibiotic medicine. HOME CARE INSTRUCTIONS   Take your antibiotic medicine as directed by your health care provider. Finish the antibiotic even if you start to feel better.  Keep the infected arm or leg elevated to reduce swelling.  Apply a warm cloth to the affected area up to 4 times per day to relieve pain.  Take medicines only as directed by your health care provider.  Keep all follow-up visits as directed by your health care provider. SEEK MEDICAL CARE IF:   You notice red streaks coming from the infected area.  Your red area gets larger or turns dark in color.  Your bone or joint underneath the infected area becomes painful after the skin has healed.  Your infection returns in the same area or another area.  You notice a swollen bump in the infected area.  You develop new symptoms.  You have a fever. SEEK IMMEDIATE MEDICAL CARE IF:    You feel very sleepy.  You develop vomiting or diarrhea.  You have a general ill feeling (malaise) with muscle aches and pains.   This information is not intended to replace advice given to you by your health care provider. Make sure you discuss any questions you have with your health care provider.   Document Released: 03/27/2005 Document Revised: 03/08/2015 Document Reviewed: 09/02/2011 Elsevier Interactive Patient Education 2016 [employer]  Peripheral Edema You have swelling in your legs (peripheral edema). This swelling is due to excess accumulation of salt and water in your body. Edema may be a sign of heart, kidney or liver disease, or a side effect of a medication. It may also be due to problems in the leg veins. Elevating your legs and using special support stockings may be very helpful, if the cause of the swelling is due to poor venous circulation. Avoid long periods of standing, whatever the cause. Treatment of edema depends on identifying the cause. Chips, pretzels, pickles and other salty foods should be avoided. Restricting salt in your diet is almost always needed. Water pills (diuretics) are often used to remove the excess salt and water from your body via urine. These medicines prevent the kidney from reabsorbing sodium. This increases urine flow. Diuretic treatment may also result in lowering of potassium levels in your body. Potassium supplements may be needed if you have to use diuretics daily. Daily weights can help you keep track of your progress in clearing your edema.  You should call your caregiver for follow up care as recommended. SEEK IMMEDIATE MEDICAL CARE IF:   You have increased swelling, pain, redness, or heat in your legs.  You develop shortness of breath, especially when lying down.  You develop chest or abdominal pain, weakness, or fainting.  You have a fever.   This information is not intended to replace advice given to you by your health care  provider. Make sure you discuss any questions you have with your health care provider.   Document Released: 07/25/2004 Document Revised: 09/09/2011 Document Reviewed: 12/28/2014 Elsevier Interactive Patient Education [employer]

## 2015-12-03 NOTE — ED Notes (Signed)
Pt reports right foot swelling and itching for approx a week. Wal[place] 10 miles a day at the park and thinks maybe something bit her. Ambulatory without difficulty.

## 2015-12-06 ENCOUNTER — Encounter (HOSPITAL_COMMUNITY): Payer: Self-pay

## 2015-12-06 ENCOUNTER — Emergency Department (HOSPITAL_COMMUNITY): Payer: Self-pay

## 2015-12-06 ENCOUNTER — Emergency Department (HOSPITAL_COMMUNITY)
Admission: EM | Admit: 2015-12-06 | Discharge: 2015-12-06 | Disposition: A | Discharge status: Home / Self Care | Payer: Self-pay | Attending: Emergency Medicine | Admitting: Emergency Medicine

## 2015-12-06 DIAGNOSIS — Z87891 Personal history of nicotine dependence: Secondary | ICD-10-CM | POA: Insufficient documentation

## 2015-12-06 DIAGNOSIS — M546 Pain in thoracic spine: Secondary | ICD-10-CM | POA: Insufficient documentation

## 2015-12-06 DIAGNOSIS — Z791 Long term (current) use of non-steroidal anti-inflammatories (NSAID): Secondary | ICD-10-CM | POA: Insufficient documentation

## 2015-12-06 DIAGNOSIS — Z79899 Other long term (current) drug therapy: Secondary | ICD-10-CM | POA: Insufficient documentation

## 2015-12-06 LAB — COMPREHENSIVE METABOLIC PANEL
ALBUMIN: 4.5 g/dL (ref 3.5–5.0)
ALK PHOS: 78 U/L (ref 38–126)
ALT: 18 U/L (ref 14–54)
AST: 22 U/L (ref 15–41)
Anion gap: 8 (ref 5–15)
BUN: 20 mg/dL (ref 6–20)
CALCIUM: 9.7 mg/dL (ref 8.9–10.3)
CO2: 24 mmol/L (ref 22–32)
CREATININE: 0.66 mg/dL (ref 0.44–1.00)
Chloride: 104 mmol/L (ref 101–111)
GFR calc Af Amer: >60 mL/min (ref >60)
GFR calc non Af Amer: >60 mL/min (ref >60)
GLUCOSE: 100 mg/dL — AB (ref 65–99)
Potassium: 4.2 mmol/L (ref 3.5–5.1)
SODIUM: 136 mmol/L (ref 135–145)
Total Bilirubin: 0.7 mg/dL (ref 0.3–1.2)
Total Protein: 8.5 g/dL — ABNORMAL HIGH (ref 6.5–8.1)

## 2015-12-06 LAB — URINALYSIS, ROUTINE W REFLEX MICROSCOPIC
Bilirubin Urine: NEGATIVE
Glucose, UA: NEGATIVE mg/dL
Hgb urine dipstick: NEGATIVE
KETONES UR: NEGATIVE mg/dL
LEUKOCYTES UA: NEGATIVE
NITRITE: NEGATIVE
PROTEIN: NEGATIVE mg/dL
Specific Gravity, Urine: 1.026 (ref 1.005–1.030)
pH: 5.5 (ref 5.0–8.0)

## 2015-12-06 LAB — CBC WITH DIFFERENTIAL/PLATELET
BASOS PCT: 0 %
Basophils Absolute: 0 10*3/uL (ref 0.0–0.1)
EOS ABS: 0.4 10*3/uL (ref 0.0–0.7)
Eosinophils Relative: 5 %
HCT: 42.2 % (ref 36.0–46.0)
HEMOGLOBIN: 14.1 g/dL (ref 12.0–15.0)
Lymphocytes Relative: 18 %
Lymphs Abs: 1.2 10*3/uL (ref 0.7–4.0)
MCH: 30.9 pg (ref 26.0–34.0)
MCHC: 33.4 g/dL (ref 30.0–36.0)
MCV: 92.5 fL (ref 78.0–100.0)
Monocytes Absolute: 0.6 10*3/uL (ref 0.1–1.0)
Monocytes Relative: 9 %
NEUTROS PCT: 68 %
Neutro Abs: 4.6 10*3/uL (ref 1.7–7.7)
Platelets: 219 10*3/uL (ref 150–400)
RBC: 4.56 MIL/uL (ref 3.87–5.11)
RDW: 13.6 % (ref 11.5–15.5)
WBC: 6.8 10*3/uL (ref 4.0–10.5)

## 2015-12-06 LAB — I-STAT TROPONIN, ED: Troponin i, poc: 0.01 ng/mL (ref 0.00–0.08)

## 2015-12-06 MED ORDER — KETOROLAC TROMETHAMINE 30 MG/ML IJ SOLN: 30.0000 mg | Freq: Once | INTRAMUSCULAR | Status: DC

## 2015-12-06 MED ORDER — CYCLOBENZAPRINE HCL 10 MG PO TABS
10.0000 mg | ORAL_TABLET | Freq: Once | ORAL | Status: AC
  Administered 2015-12-06: 10 mg via ORAL
  Filled 2015-12-06: qty 1

## 2015-12-06 MED ORDER — IBUPROFEN 800 MG PO TABS
800.0000 mg | ORAL_TABLET | Freq: Once | ORAL | Status: AC
  Administered 2015-12-06: 800 mg via ORAL
  Filled 2015-12-06: qty 1

## 2015-12-06 MED ORDER — NAPROXEN 500 MG PO TABS: 500.0000 mg | ORAL_TABLET | Freq: Two times a day (BID) | ORAL | Status: DC

## 2015-12-06 MED ORDER — CYCLOBENZAPRINE HCL 10 MG PO TABS: 10.0000 mg | ORAL_TABLET | Freq: Two times a day (BID) | ORAL | Status: DC | PRN

## 2015-12-06 NOTE — ED Notes (Signed)
Pt states that she has had back pain that is between shoulder blades and radiates to lower back; pt states that she has chest pain that radiates to the right side if her chest / shoulder area and down right side; pt states it hurts to take a deep breath; pt c/o intermittent left hand numbness x 1 month especially in the morning; pt states pain is worse with movement

## 2015-12-06 NOTE — Discharge Instructions (Signed)
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
## 2015-12-06 NOTE — ED Provider Notes (Signed)
CSN: [id number]     Arrival date & time 12/06/15  0602 History   First MD Initiated Contact with Patient 12/06/15 0636     Chief Complaint  Patient presents with  . Back Pain   HPI  Elizabeth Gallagher is a 55 year old female with PMHx of heart murmur and rheumatic fever presenting with back pain. Patient reports onset of symptoms yesterday. The pain is at her right shoulder blade and radiates to her right lumbar region with movement. She describes it as a sharp and pinching sensation. This is exacerbated by bending forward and movement. Resting alleviates the pain. She also notes that the pain radiates through to her chest on deep inspiration. Denies exertional chest pain or shortness of breath. 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 Denies specific injury to her back but does note she has been working a lot recently. Denies history of DVT or PE. Denies posterior calf pain, unilateral lower externally swelling, recent surgery, long travel, immobilization or exogenous hormones. No history of cancer. Denies associated fever, chills, headache, syncope, diaphoresis, neck pain, abdominal pain, nausea, vomiting, dysuria or hematuria. She also complains of bilateral hand stiffness and numbness over the past month. The sensation is present upon waking and resolves with "working my fingers out". She is not currently experiencing the stiffness or numbness in her hands. Denies associated joint swelling, redness of the joints, warmth of her joints or weakness.   Past Medical History  Diagnosis Date  . Heart murmur   . Rheumatic fever    History reviewed. No pertinent past surgical history. Family History  Problem Relation Age of Onset  . Kidney disease Mother    Social History  Substance Use Topics  . Smoking status: Former Games developermoker  . Smokeless tobacco: None  . Alcohol Use: No   OB History    No data available     Review of Systems  All other systems reviewed and are  negative.     Allergies  Review of patient's allergies indicates no known allergies.  Home Medications   Prior to Admission medications   Medication Sig Start Date End Date Taking? Authorizing Provider  cephALEXin (KEFLEX) 500 MG capsule Take 1 capsule (500 mg total) by mouth 4 (four) times daily. 12/03/15  Yes Trixie DredgeEmily West, PA-C  diphenhydrAMINE (BENADRYL) 25 MG tablet Take 1 tablet (25 mg total) by mouth every 6 (six) hours as needed for itching (and swelling). 12/03/15  Yes Trixie DredgeEmily West, PA-C  cyclobenzaprine (FLEXERIL) 10 MG tablet Take 1 tablet (10 mg total) by mouth 2 (two) times daily as needed for muscle spasms. 12/06/15   Leahna Hewson, PA-C  naproxen (NAPROSYN) 500 MG tablet Take 1 tablet (500 mg total) by mouth 2 (two) times daily. 12/06/15   Allexus Ovens, PA-C   BP 112/64 mmHg  Pulse 68  Temp(Src) 98.1 F (36.7 C) (Oral)  Resp 15  SpO2 100%  LMP 08/18/2013 Physical Exam  Constitutional: She is oriented to person, place, and time. She appears well-developed and well-nourished. No distress.  Nontoxic-appearing  HENT:  Head: Normocephalic and atraumatic.  Eyes: Conjunctivae are normal. Right eye exhibits no discharge. Left eye exhibits no discharge. No scleral icterus.  Neck: Normal range of motion.  Cardiovascular: Normal rate, regular rhythm and intact distal pulses.   Cap refill < 2 seconds  Pulmonary/Chest: Effort normal and breath sounds normal. No respiratory distress. She has no wheezes. She has no rales.    Tenderness to palpation at the  medial scapular border. No tenderness to palpation of the anterior chest wall.  Abdominal: Soft. She exhibits no distension. There is no tenderness.  No CVA tenderness  Musculoskeletal: Normal range of motion.  Moves all extremities spontaneously. No TTP over the digits, hands or wrists. No joint swelling, erythema or warmth. No posterior calf tenderness to palpation bilaterally. No unilateral calf swelling.  Neurological: She is alert  and oriented to person, place, and time. Coordination normal.  5/5 strength of BUE. Sensation to light touch intact throughout.   Skin: Skin is warm and dry.  Psychiatric: She has a normal mood and affect. Her behavior is normal.  Nursing note and vitals reviewed.   ED Course  Procedures (including critical care time) Labs Review Labs Reviewed  COMPREHENSIVE METABOLIC PANEL - Abnormal; Notable for the following:    Glucose, Bld 100 (*)    Total Protein 8.5 (*)    All other components within normal limits  CBC WITH DIFFERENTIAL/PLATELET  URINALYSIS, ROUTINE W REFLEX MICROSCOPIC (NOT AT [facility])  Rosezena Sensor, ED    Imaging Review Dg Chest 2 View  12/06/2015  CLINICAL DATA:  Right-sided chest pain shortness of breath beginning yesterday. Heart murmur and history of rheumatic fever. EXAM: CHEST  2 VIEW COMPARISON:  09/21/2013 FINDINGS: The heart size and mediastinal contours are within normal limits. Both lungs are clear. The visualized skeletal structures are unremarkable. IMPRESSION: No active cardiopulmonary disease. Electronically Signed   By: Myles Rosenthal M.D.   On: 12/06/2015 07:11   I have personally reviewed and evaluated these images and lab results as part of my medical decision-making.   EKG Interpretation   Date/Time:  Wednesday December 06 2015 06:34:06 EDT Ventricular Rate:  83 PR Interval:  161 QRS Duration: 78 QT Interval:  360 QTC Calculation: 423 R Axis:   3 Text Interpretation:  Sinus rhythm No significant change was found  Confirmed by MOLPUS  MD, Jonny Ruiz ([id number]) on 12/06/2015 6:57:42 AM      MDM   Final diagnoses:  Right-sided thoracic back pain   55 year old female presenting with right-sided thoracic back pain 1 day and morning bilateral hand stiffness and numbness x one month. Afebrile and hemodynamically stable. There is tenderness to palpation along the right medial scapular border. Heart is regular rate and rhythm. Lungs are clear to auscultation  bilaterally. No CVA tenderness. Bilateral upper extremities are neurovascularly intact with full range of motion. No tenderness over the hands or joint abnormalities. Blood work is reassuring. Urinalysis without signs of infection. Troponin negative with nonischemic EKG. Chest x-ray negative. Patient has a well score of 0 and is low risk for PE. No hypoxia or tachycardia. Given ibuprofen and Flexeril emergency department which improved her back pain. This is likely musculoskeletal in nature given presentation and workup. Bilateral hand stiffness and numbness in the morning may be related to arthritis but does not have any red flags for acute emergencies. Patient does not have a PCP and I have encouraged her to follow up with community health and wellness. Will discharge with symptomatic care. I have also discussed reasons to return immediately to the emergency department. Pt is stable for discharge.     Rolm Gala Devone Tousley, PA-C 12/06/15 1015  Lyndal Pulley, MD 12/06/15 [phone]

## 2015-12-06 NOTE — ED Notes (Signed)
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

## 2018-10-03 ENCOUNTER — Other Ambulatory Visit: Payer: Self-pay

## 2018-10-03 ENCOUNTER — Emergency Department (HOSPITAL_COMMUNITY)
Admission: EM | Admit: 2018-10-03 | Discharge: 2018-10-03 | Disposition: A | Discharge status: Home / Self Care | Payer: Self-pay | Attending: Emergency Medicine | Admitting: Emergency Medicine

## 2018-10-03 ENCOUNTER — Encounter (HOSPITAL_COMMUNITY): Payer: Self-pay | Admitting: *Deleted

## 2018-10-03 DIAGNOSIS — R220 Localized swelling, mass and lump, head: Secondary | ICD-10-CM

## 2018-10-03 DIAGNOSIS — Z87891 Personal history of nicotine dependence: Secondary | ICD-10-CM | POA: Insufficient documentation

## 2018-10-03 DIAGNOSIS — R609 Edema, unspecified: Secondary | ICD-10-CM

## 2018-10-03 DIAGNOSIS — Z79899 Other long term (current) drug therapy: Secondary | ICD-10-CM | POA: Insufficient documentation

## 2018-10-03 DIAGNOSIS — K115 Sialolithiasis: Secondary | ICD-10-CM | POA: Insufficient documentation

## 2018-10-03 DIAGNOSIS — K1120 Sialoadenitis, unspecified: Secondary | ICD-10-CM

## 2018-10-03 NOTE — Discharge Instructions (Addendum)
You likely have a salivary gland stone that is causing swelling of your parotid gland. Eat sour foods/candy to help pass this stone. Massage the cheek both inside and outside of the mouth to help pass the stone. Apply cool compresses to your face to help with pain and swelling. Alternate between tylenol and ibuprofen as needed for pain. May consider using over the counter antihistamine such as benadryl, zyrtec, claritin, etc to help with the ear popping sensation. Follow up with your regular doctor this week for recheck of symptoms. Return to the ER for emergent changes or worsening symptoms.

## 2018-10-03 NOTE — ED Provider Notes (Signed)
[facility]-EMERGENCY DEPT Provider Note   CSN: [id number] Arrival date & time: 10/03/18  1945    History   Chief Complaint Chief Complaint  Patient presents with  . Facial Swelling    HPI    Elizabeth Gallagher is a 58 y.o. female who presents to the ED with complaints of right facial swelling that began just prior to arrival.  Patient states that earlier today she noticed that her ears felt like they needed to pop while she was mowing grass.  She finished mowing her fourth lawn and still noticed the ear popping sensation.  She went to [facility] and ate an ice cream and a fiber one cookie, which she has eaten before, but shortly after that she noticed some pain and swelling to the right cheek.  She describes the pain as 7/10 intermittent only with palpation, sore nonradiating right cheek pain that worsens only with palpation, with no treatments tried prior to arrival.  She states that if she is not touching the cheek it does not hurt.  When she noticed the swelling she decided to come for evaluation.  She mentions that the swelling only started after she ate the ice cream and cookie.  She does not take any medications, denies any new changes, lotions, detergents, or any contact with animals or plants that she is aware of other than cutting the lawn.  She is up-to-date with her vaccines.  She denies any dental pain, ear pain or drainage, sore throat, rhinorrhea, drooling, trismus, tongue or lip swelling, difficulty swallowing or breathing, recent fevers or chills, chest pain, shortness of breath, abdominal pain, nausea, vomiting, diarrhea, constipation, dysuria, hematuria, numbness, tingling, focal weakness, or any other complaints at this time.  The history is provided by the patient and medical records. No language interpreter was used.    Past Medical History:  Diagnosis Date  . Heart murmur   . Rheumatic fever     There are no active problems to display for this patient.   History reviewed. No pertinent surgical history.   OB History   No obstetric history on file.      Home Medications    Prior to Admission medications   Medication Sig Start Date End Date Taking? Authorizing Provider  cephALEXin (KEFLEX) 500 MG capsule Take 1 capsule (500 mg total) by mouth 4 (four) times daily. 12/03/15   Trixie Dredge, PA-C  cyclobenzaprine (FLEXERIL) 10 MG tablet Take 1 tablet (10 mg total) by mouth 2 (two) times daily as needed for muscle spasms. 12/06/15   Barrett, Rolm Gala, PA-C  diphenhydrAMINE (BENADRYL) 25 MG tablet Take 1 tablet (25 mg total) by mouth every 6 (six) hours as needed for itching (and swelling). 12/03/15   Trixie Dredge, PA-C  naproxen (NAPROSYN) 500 MG tablet Take 1 tablet (500 mg total) by mouth 2 (two) times daily. 12/06/15   Barrett, Rolm Gala, PA-C    Family History Family History  Problem Relation Age of Onset  . Kidney disease Mother     Social History Social History   Tobacco Use  . Smoking status: Former Smoker  Substance Use Topics  . Alcohol use: No  . Drug use: No     Allergies   Patient has no known allergies.   Review of Systems Review of Systems  Constitutional: Negative for chills and fever.  HENT: Positive for facial swelling. Negative for drooling, ear discharge, ear pain, rhinorrhea, sore throat and trouble swallowing.   Respiratory: Negative for shortness of breath.  Cardiovascular: Negative for chest pain.  Gastrointestinal: Negative for abdominal pain, constipation, diarrhea, nausea and vomiting.  Genitourinary: Negative for dysuria and hematuria.  Musculoskeletal: Negative for arthralgias and myalgias.  Skin: Negative for color change.  Allergic/Immunologic: Negative for immunocompromised state.  Neurological: Negative for weakness and numbness.  Psychiatric/Behavioral: Negative for confusion.   All other systems reviewed and are negative for acute change except as noted in the HPI.    Physical Exam Updated Vital  Signs BP (!) 160/98 (BP Location: Left Arm)   Pulse 96   Temp 97.9 F (36.6 C) (Oral)   Resp 18   Ht 5\' 6"  (1.676 m)   Wt 104.3 kg   LMP 09/21/2013   SpO2 100%   BMI 37.12 kg/m   Physical Exam Vitals signs and nursing note reviewed.  Constitutional:      General: She is not in acute distress.    Appearance: Normal appearance. She is well-developed. She is not toxic-appearing.     Comments: Afebrile, nontoxic, NAD  HENT:     Head: Normocephalic and atraumatic.     Right Ear: Hearing, ear canal and external ear normal. A middle ear effusion (serous) is present. Tympanic membrane is not injected, erythematous or bulging.     Left Ear: Hearing, ear canal and external ear normal. A middle ear effusion (serous) is present. Tympanic membrane is not injected, erythematous or bulging.     Ears:     Comments: Mild serous effusions behind both TMs, no bulging or erythema of TMs, canals otherwise clear.     Nose: Nose normal.     Comments: Nose clear    Mouth/Throat:     Mouth: Mucous membranes are moist. No oral lesions or angioedema.     Dentition: No dental tenderness, gingival swelling, dental caries or dental abscesses.     Pharynx: Oropharynx is clear. Uvula midline. No pharyngeal swelling, oropharyngeal exudate, posterior oropharyngeal erythema or uvula swelling.     Tonsils: No tonsillar exudate or tonsillar abscesses.     Comments: R facial swelling over R parotid gland, mild TTP, no overlying erythema or warmth. No swelling or erythema to stensen's duct, mild TTP over the duct, no drainage from the duct. No oral lesions.  Oropharynx clear and moist, without uvular swelling or deviation, no trismus or drooling, no tonsillar swelling or erythema, no exudates.  No PTA. No dental abscesses or tenderness. No gingival swelling or erythema. No tongue/lip swelling, no angioedema.  Eyes:     General:        Right eye: No discharge.        Left eye: No discharge.     Conjunctiva/sclera:  Conjunctivae normal.  Neck:     Musculoskeletal: Normal range of motion and neck supple.  Cardiovascular:     Rate and Rhythm: Normal rate.     Pulses: Normal pulses.  Pulmonary:     Effort: Pulmonary effort is normal. No respiratory distress.     Breath sounds: Normal breath sounds and air entry. No stridor, decreased air movement or transmitted upper airway sounds. No decreased breath sounds, wheezing, rhonchi or rales.     Comments: CTAB in all lung fields, no w/r/r, no hypoxia or increased WOB, speaking in full sentences, SpO2 100% on RA  Abdominal:     General: There is no distension.  Musculoskeletal: Normal range of motion.  Skin:    General: Skin is warm and dry.     Findings: No rash.  Neurological:  Mental Status: She is alert and oriented to person, place, and time.     Sensory: Sensation is intact. No sensory deficit.     Motor: Motor function is intact.  Psychiatric:        Mood and Affect: Mood and affect normal.        Behavior: Behavior normal.      ED Treatments / Results  Labs (all labs ordered are listed, but only abnormal results are displayed) Labs Reviewed - No data to display  EKG None  Radiology No results found.  Procedures Procedures (including critical care time)  Medications Ordered in ED Medications - No data to display   Initial Impression / Assessment and Plan / ED Course  I have reviewed the triage vital signs and the nursing notes.  Pertinent labs & imaging results that were available during my care of the patient were reviewed by me and considered in my medical decision making (see chart for details).        58 y.o. female here with R facial swelling that began suddenly PTA. Some ear popping sensation earlier in the day. Was mowing grass. Ate ice cream and a cookie prior to onset of swelling. On exam, no dental abscess or tenderness, throat clear, handling secretions well, ears with serous effusions bilaterally but no evidence  of AOM/AOE, nose clear, R face with swelling over the parotid gland with mild TTP, no overlying skin changes, some tenderness to stensen's duct but no drainage or erythema to the duct. Likely parotid gland stone causing swelling. Doubt infection. Doubt allergic reaction. No evidence of dental etiology on exam. Doubt need for imaging/labs. Advised use of sour candies/foods, milking the duct, and tylenol/motrin/ice use. Doubt need for abx or steroids at this time. Advised use of antihistamines for her ear popping sensation and slight amount of fluid in the ears. F/up with PCP this week for recheck. Strict return precautions advised. Discussed case with my attending Dr. Ranae Palms who also evaluated the pt and agrees with plan. I explained the diagnosis and have given explicit precautions to return to the ER including for any other new or worsening symptoms. The patient understands and accepts the medical plan as it's been dictated and I have answered their questions. Discharge instructions concerning home care and prescriptions have been given. The patient is STABLE and is discharged to home in good condition.    Final Clinical Impressions(s) / ED Diagnoses   Final diagnoses:  Parotid swelling  Parotitis  Parotid sialolithiasis  Facial swelling    ED Discharge Orders    [address], [place], [place] 10/03/18 2035    Loren Racer, MD 10/03/18 2158

## 2018-10-03 NOTE — ED Triage Notes (Signed)
Pt says that she has been mowing grass all day and started to have some swelling in the right side of her face. No difficulty swallowing or shortness of breath. Some tenderness to the area.

## 2019-01-01 ENCOUNTER — Encounter (HOSPITAL_COMMUNITY): Payer: Self-pay

## 2019-01-01 ENCOUNTER — Other Ambulatory Visit: Payer: Self-pay

## 2019-01-01 ENCOUNTER — Emergency Department (HOSPITAL_COMMUNITY)
Admission: EM | Admit: 2019-01-01 | Discharge: 2019-01-02 | Disposition: A | Discharge status: Home / Self Care | Payer: Self-pay | Attending: Emergency Medicine | Admitting: Emergency Medicine

## 2019-01-01 ENCOUNTER — Emergency Department (HOSPITAL_COMMUNITY): Payer: Self-pay

## 2019-01-01 DIAGNOSIS — Z87891 Personal history of nicotine dependence: Secondary | ICD-10-CM | POA: Insufficient documentation

## 2019-01-01 DIAGNOSIS — M25512 Pain in left shoulder: Secondary | ICD-10-CM | POA: Insufficient documentation

## 2019-01-01 DIAGNOSIS — X500XXA Overexertion from strenuous movement or load, initial encounter: Secondary | ICD-10-CM | POA: Insufficient documentation

## 2019-01-01 LAB — CBC WITH DIFFERENTIAL/PLATELET
Abs Immature Granulocytes: 0.01 10*3/uL (ref 0.00–0.07)
Basophils Absolute: 0 10*3/uL (ref 0.0–0.1)
Basophils Relative: 0 %
Eosinophils Absolute: 0.1 10*3/uL (ref 0.0–0.5)
Eosinophils Relative: 2 %
HCT: 43.5 % (ref 36.0–46.0)
Hemoglobin: 13.9 g/dL (ref 12.0–15.0)
Immature Granulocytes: 0 %
Lymphocytes Relative: 26 %
Lymphs Abs: 1.3 10*3/uL (ref 0.7–4.0)
MCH: 31.2 pg (ref 26.0–34.0)
MCHC: 32 g/dL (ref 30.0–36.0)
MCV: 97.8 fL (ref 80.0–100.0)
Monocytes Absolute: 0.3 10*3/uL (ref 0.1–1.0)
Monocytes Relative: 7 %
Neutro Abs: 3.1 10*3/uL (ref 1.7–7.7)
Neutrophils Relative %: 65 %
Platelets: 182 10*3/uL (ref 150–400)
RBC: 4.45 MIL/uL (ref 3.87–5.11)
RDW: 14 % (ref 11.5–15.5)
WBC: 4.8 10*3/uL (ref 4.0–10.5)
nRBC: 0 % (ref 0.0–0.2)

## 2019-01-01 MED ORDER — CYCLOBENZAPRINE HCL 10 MG PO TABS
10.0000 mg | ORAL_TABLET | Freq: Once | ORAL | Status: AC
  Administered 2019-01-01: 10 mg via ORAL
  Filled 2019-01-01: qty 1

## 2019-01-01 NOTE — ED Notes (Signed)
Attempt to start U/S IV-patient states to take it out-states "I just can't stand it"-patient declined IV-explained this is the only way to obtain PE study-patient states "I just still can't do it".

## 2019-01-01 NOTE — ED Provider Notes (Signed)
Patient signed out to me at end of shift by Dr. Ronnald Nian. Patient to ED with c/o left shoulder pain, somewhat worse with breathing but denies SOB, cough. No fever. Pain is felt to be musculoskeletal, however, found to have small left pleural effusion on chest x-ray. No chest pain. In consideration of PE, cardiac origin, labs ordered with CTA chest r/o PE.   11:10 - multiple attempts at starting IV appropriate for PE study fail and the patient is reluctant to have further attempts. Discussed the reason for the test. She will allow blood draw from hand but not another IV. D-dimer added to test panel. Will update the patient on results.   12:40 - D-dimer negative. Other lab studies WNL. She can be discharged home with treatment for likely musculoskeletal pain with Naproxen, Flexeril.    Charlann Lange, PA-C 01/02/19 0045    Lennice Sites, DO 01/02/19 2336

## 2019-01-01 NOTE — ED Notes (Addendum)
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 Pt has fear of needles and is not wanting to have iv placed. Provider made aware. Pt has been explained purpose of IV need for CT scan

## 2019-01-01 NOTE — ED Triage Notes (Signed)
Pt reports soreness under her L shoulder since Tuesday. States that she thinks that she pulled a muscle. Pt lifts and is very active at work. Advil has helped, but she states the pain returns as soon as it wears off.

## 2019-01-01 NOTE — ED Provider Notes (Signed)
[facility] Note   CSN: [id number] Arrival date & time: 01/01/19  2057    History   Chief Complaint Chief Complaint  Patient presents with  . Shoulder Pain    HPI Elizabeth Gallagher is a 58 y.o. female.     The history is provided by the patient.  Shoulder Pain Location:  Shoulder Shoulder location:  L shoulder Injury: no   Pain details:    Quality:  Aching and dull   Radiates to:  Does not radiate   Severity:  Mild   Onset quality:  Gradual   Timing:  Intermittent   Progression:  Waxing and waning Prior injury to area:  No Relieved by:  NSAIDs Worsened by:  Movement Associated symptoms: no back pain, no decreased range of motion, no fatigue, no fever, no muscle weakness, no neck pain, no numbness, no stiffness, no swelling and no tingling     Past Medical History:  Diagnosis Date  . Heart murmur   . Rheumatic fever     There are no active problems to display for this patient.   History reviewed. No pertinent surgical history.   OB History   No obstetric history on file.      Home Medications    Prior to Admission medications   Medication Sig Start Date End Date Taking? Authorizing Provider  cephALEXin (KEFLEX) 500 MG capsule Take 1 capsule (500 mg total) by mouth 4 (four) times daily. Patient not taking: Reported on 10/03/2018 12/03/15   Trixie DredgeWest, Emily, PA-C  cyclobenzaprine (FLEXERIL) 10 MG tablet Take 1 tablet (10 mg total) by mouth 2 (two) times daily as needed for muscle spasms. Patient not taking: Reported on 10/03/2018 12/06/15   Barrett, Rolm GalaStevi, PA-C  diphenhydrAMINE (BENADRYL) 25 MG tablet Take 1 tablet (25 mg total) by mouth every 6 (six) hours as needed for itching (and swelling). Patient not taking: Reported on 10/03/2018 12/03/15   Trixie DredgeWest, Emily, PA-C  naproxen (NAPROSYN) 500 MG tablet Take 1 tablet (500 mg total) by mouth 2 (two) times daily. Patient not taking: Reported on 10/03/2018 12/06/15   Barrett, Rolm GalaStevi, PA-C     Family History Family History  Problem Relation Age of Onset  . Kidney disease Mother     Social History Social History   Tobacco Use  . Smoking status: Former Smoker  Substance Use Topics  . Alcohol use: No  . Drug use: No     Allergies   Patient has no known allergies.   Review of Systems Review of Systems  Constitutional: Negative for chills, fatigue and fever.  HENT: Negative for ear pain and sore throat.   Eyes: Negative for pain and visual disturbance.  Respiratory: Negative for cough and shortness of breath.   Cardiovascular: Negative for chest pain and palpitations.  Gastrointestinal: Negative for abdominal pain and vomiting.  Genitourinary: Negative for dysuria and hematuria.  Musculoskeletal: Positive for arthralgias. Negative for back pain, neck pain and stiffness.  Skin: Negative for color change and rash.  Neurological: Negative for seizures and syncope.  All other systems reviewed and are negative.    Physical Exam Updated Vital Signs  ED Triage Vitals  Enc Vitals Group     BP 01/01/19 2109 (!) 155/101     Pulse Rate 01/01/19 2109 93     Resp 01/01/19 2109 16     Temp 01/01/19 2109 98.3 F (36.8 C)     Temp Source 01/01/19 2109 Oral     SpO2 01/01/19 2109  100 %     Weight --      Height --      Head Circumference --      Peak Flow --      Pain Score 01/01/19 2110 8     Pain Loc --      Pain Edu? --      Excl. in GC? --     Physical Exam Vitals signs and nursing note reviewed.  Constitutional:      General: She is not in acute distress.    Appearance: She is well-developed.  HENT:     Head: Normocephalic and atraumatic.  Eyes:     Conjunctiva/sclera: Conjunctivae normal.  Neck:     Musculoskeletal: Neck supple.  Cardiovascular:     Rate and Rhythm: Normal rate and regular rhythm.     Heart sounds: No murmur.  Pulmonary:     Effort: Pulmonary effort is normal. No respiratory distress.     Breath sounds: Normal breath sounds.   Abdominal:     Palpations: Abdomen is soft.     Tenderness: There is no abdominal tenderness.  Musculoskeletal:        General: Tenderness present.       Arms:  Skin:    General: Skin is warm and dry.  Neurological:     Mental Status: She is alert.      ED Treatments / Results  Labs (all labs ordered are listed, but only abnormal results are displayed) Labs Reviewed  CBC WITH DIFFERENTIAL/PLATELET  BASIC METABOLIC PANEL  TROPONIN I (HIGH SENSITIVITY)    EKG EKG Interpretation  Date/Time:  Friday January 01 2019 21:24:01 EDT Ventricular Rate:  93 PR Interval:    QRS Duration: 74 QT Interval:  334 QTC Calculation: 414 R Axis:   -9 Text Interpretation:  Sinus rhythm Probable left atrial enlargement Left ventricular hypertrophy Confirmed by Virgina NorfolkAdam, Jenesis Martin [phone]([id number]) on 01/01/2019 9:32:36 PM   Radiology Dg Chest 2 View  Result Date: 01/01/2019 CLINICAL DATA:  Soreness under left shoulder for several days. EXAM: CHEST - 2 VIEW COMPARISON:  December 06, 2015 FINDINGS: There is a tiny left pleural effusion with blunting of left costophrenic angle. The heart, hila, mediastinum, lungs, and pleura are otherwise unremarkable. IMPRESSION: Tiny left pleural effusion.  No other abnormalities. Electronically Signed   By: Gerome Samavid  Williams III M.D   On: 01/01/2019 21:30    Procedures Procedures (including critical care time)  Medications Ordered in ED Medications  cyclobenzaprine (FLEXERIL) tablet 10 mg (10 mg Oral Given 01/01/19 2117)     Initial Impression / Assessment and Plan / ED Course  I have reviewed the triage vital signs and the nursing notes.  Pertinent labs & imaging results that were available during my care of the patient were reviewed by me and considered in my medical decision making (see chart for details).     Elizabeth KosSarah Gallagher is a 58 year old female with history of rheumatic fever who presents to the ED with left shoulder pain.  Patient with normal vitals.  No fever.  Pain on  and off for the last several days.  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  Worse with inspiration.  Pain gets better with Aleve.  Denies any chest pain, shortness of breath.  Has tenderness underneath the left scapular region.  Has similar symptoms several years ago on the right side.  EKG shows sinus rhythm.  No ischemic changes.  However, chest x-ray shows  small left pleural effusion.  Given pleuritic nature of pain will further evaluate with troponin, basic labs, PE scan.  Suspect likely musculoskeletal however will rule out other causes, including PE. Overall patient does not have chest pain.  No cardiac risk factors.  Patient was signed out to oncoming ED staff with patient pending lab work and scan.  Please see their note for further results, evaluation, disposition of patient.  This chart was dictated using voice recognition software.  Despite best efforts to proofread,  errors can occur which can change the documentation meaning.    Final Clinical Impressions(s) / ED Diagnoses   Final diagnoses:  Acute pain of left shoulder    ED Discharge Orders    None       Lennice Sites, DO 01/01/19 2152

## 2019-01-01 NOTE — ED Notes (Signed)
Pt is alert and

## 2019-01-02 LAB — BASIC METABOLIC PANEL
Anion gap: 12 (ref 5–15)
BUN: 18 mg/dL (ref 6–20)
CO2: 21 mmol/L — ABNORMAL LOW (ref 22–32)
Calcium: 9.4 mg/dL (ref 8.9–10.3)
Chloride: 106 mmol/L (ref 98–111)
Creatinine, Ser: 0.6 mg/dL (ref 0.44–1.00)
GFR calc Af Amer: >60 mL/min (ref >60)
GFR calc non Af Amer: >60 mL/min (ref >60)
Glucose, Bld: 130 mg/dL — ABNORMAL HIGH (ref 70–99)
Potassium: 4.3 mmol/L (ref 3.5–5.1)
Sodium: 139 mmol/L (ref 135–145)

## 2019-01-02 LAB — D-DIMER, QUANTITATIVE (NOT AT ARMC): D-Dimer, Quant: 0.27 ug/mL-FEU (ref 0.00–0.50)

## 2019-01-02 LAB — TROPONIN I (HIGH SENSITIVITY): Troponin I (High Sensitivity): 2 ng/L (ref <18)

## 2019-01-02 MED ORDER — NAPROXEN 375 MG PO TABS: 375.0000 mg | ORAL_TABLET | Freq: Two times a day (BID) | ORAL | 0 refills | Status: DC

## 2019-01-02 MED ORDER — CYCLOBENZAPRINE HCL 10 MG PO TABS: 10.0000 mg | ORAL_TABLET | Freq: Two times a day (BID) | ORAL | 0 refills | Status: DC | PRN

## 2019-01-02 MED ORDER — NAPROXEN 375 MG PO TABS
375.0000 mg | ORAL_TABLET | Freq: Once | ORAL | Status: AC
  Administered 2019-01-02: 375 mg via ORAL
  Filled 2019-01-02: qty 1

## 2019-01-02 NOTE — Discharge Instructions (Signed)
Take medications as prescribed. Follow up with a primary care provider of your choice for routine medical care and for re-evaluation if shoulder pain continues.

## 2019-01-12 ENCOUNTER — Other Ambulatory Visit: Payer: Self-pay

## 2019-01-12 ENCOUNTER — Emergency Department (HOSPITAL_COMMUNITY)
Admission: EM | Admit: 2019-01-12 | Discharge: 2019-01-12 | Disposition: A | Discharge status: Home / Self Care | Payer: Self-pay | Attending: Emergency Medicine | Admitting: Emergency Medicine

## 2019-01-12 DIAGNOSIS — T7840XA Allergy, unspecified, initial encounter: Secondary | ICD-10-CM | POA: Insufficient documentation

## 2019-01-12 DIAGNOSIS — T63441A Toxic effect of venom of bees, accidental (unintentional), initial encounter: Secondary | ICD-10-CM | POA: Insufficient documentation

## 2019-01-12 DIAGNOSIS — Z87891 Personal history of nicotine dependence: Secondary | ICD-10-CM | POA: Insufficient documentation

## 2019-01-12 DIAGNOSIS — R220 Localized swelling, mass and lump, head: Secondary | ICD-10-CM | POA: Insufficient documentation

## 2019-01-12 DIAGNOSIS — R0789 Other chest pain: Secondary | ICD-10-CM | POA: Insufficient documentation

## 2019-01-12 MED ORDER — EPINEPHRINE 0.3 MG/0.3ML IJ SOAJ: 0.3000 mg | INTRAMUSCULAR | 0 refills | Status: AC | PRN

## 2019-01-12 MED ORDER — FAMOTIDINE 20 MG PO TABS: 20.0000 mg | ORAL_TABLET | Freq: Two times a day (BID) | ORAL | 0 refills | Status: DC

## 2019-01-12 MED ORDER — PREDNISONE 50 MG PO TABS: 50.0000 mg | ORAL_TABLET | Freq: Every day | ORAL | 0 refills | Status: DC

## 2019-01-12 MED ORDER — DIPHENHYDRAMINE HCL 50 MG/ML IJ SOLN
50.0000 mg | Freq: Once | INTRAMUSCULAR | Status: AC
  Administered 2019-01-12: 50 mg via INTRAMUSCULAR
  Filled 2019-01-12: qty 1

## 2019-01-12 MED ORDER — DIPHENHYDRAMINE HCL 25 MG PO TABS: 25.0000 mg | ORAL_TABLET | Freq: Four times a day (QID) | ORAL | 0 refills | Status: DC

## 2019-01-12 MED ORDER — DEXAMETHASONE SODIUM PHOSPHATE 10 MG/ML IJ SOLN
10.0000 mg | Freq: Once | INTRAMUSCULAR | Status: AC
  Administered 2019-01-12: 10 mg via INTRAMUSCULAR
  Filled 2019-01-12: qty 1

## 2019-01-12 MED ORDER — FAMOTIDINE 20 MG PO TABS
40.0000 mg | ORAL_TABLET | Freq: Once | ORAL | Status: AC
  Administered 2019-01-12: 40 mg via ORAL
  Filled 2019-01-12: qty 2

## 2019-01-12 NOTE — ED Triage Notes (Signed)
On way to work and had bee sting to back of neck while driving.  Initially c/o swelling to lips and overall head. Denies any tightness to throat or difficulty breathing.  States her sx are resolving.   Pt alert and in NAD.

## 2019-01-12 NOTE — ED Provider Notes (Signed)
[facility] Provider Note   CSN: [id number] Arrival date & time: 01/12/19  1554    History   Chief Complaint No chief complaint on file.   HPI Elizabeth Gallagher is a 58 y.o. female with history of rheumatic fever and heart murmur, but otherwise healthy who presents following bee sting that occurred around 1530 today while patient was driving.  She states it stung her in the middle of her back.  She reports feeling like she was going to pass out, breaking out in hives on her arms and swelling to her face, lips, tongue, and arms.  She reports all this is improving now, however.  She did not take any medications prior to arrival.  She reports she still feels like her face is swollen and a little bit on her tongue, but she is feeling much better.  She is no history of Bee venom allergy and she states she was stung 10 to 15 years ago and took Benadryl and hadno reaction like this.  She does not have an EpiPen.  Patient denies any chest pain, but did have some chest tightness initially.  That has resolved now.  She denies any shortness of breath, abdominal pain, nausea, vomiting.     HPI  Past Medical History:  Diagnosis Date  . Heart murmur   . Rheumatic fever     There are no active problems to display for this patient.   No past surgical history on file.   OB History   No obstetric history on file.      Home Medications    Prior to Admission medications   Medication Sig Start Date End Date Taking? Authorizing Provider  Aspirin-Caffeine (BAYER BACK & BODY PAIN EX ST) 500-32.5 MG TABS Take 2 tablets by mouth daily as needed (pain).    [provider]  cephALEXin (KEFLEX) 500 MG capsule Take 1 capsule (500 mg total) by mouth 4 (four) times daily. Patient not taking: Reported on 10/03/2018 12/03/15   Trixie DredgeWest, Emily, PA-C  cyclobenzaprine (FLEXERIL) 10 MG tablet Take 1 tablet (10 mg total) by mouth 2 (two) times daily as needed for muscle spasms.  01/02/19   Elpidio AnisUpstill, Shari, PA-C  diphenhydrAMINE (BENADRYL) 25 MG tablet Take 1 tablet (25 mg total) by mouth every 6 (six) hours for 1 day. 01/12/19 01/13/19  Taylen Wendland, Waylan BogaAlexandra M, PA-C  EPINEPHrine (EPIPEN 2-PAK) 0.3 mg/0.3 mL IJ SOAJ injection Inject 0.3 mLs (0.3 mg total) into the muscle as needed for anaphylaxis. 01/12/19   Edwena Mayorga, Waylan BogaAlexandra M, PA-C  famotidine (PEPCID) 20 MG tablet Take 1 tablet (20 mg total) by mouth 2 (two) times daily. 01/12/19   Chauntay Paszkiewicz, Waylan BogaAlexandra M, PA-C  ibuprofen (ADVIL) 200 MG tablet Take 200 mg by mouth daily as needed for moderate pain.    [provider]  naproxen (NAPROSYN) 375 MG tablet Take 1 tablet (375 mg total) by mouth 2 (two) times daily. 01/02/19   Elpidio AnisUpstill, Shari, PA-C  predniSONE (DELTASONE) 50 MG tablet Take 1 tablet (50 mg total) by mouth daily with breakfast. 01/12/19   Ellieanna Funderburg, Waylan BogaAlexandra M, PA-C    Family History Family History  Problem Relation Age of Onset  . Kidney disease Mother     Social History Social History   Tobacco Use  . Smoking status: Former Smoker  Substance Use Topics  . Alcohol use: No  . Drug use: No     Allergies   Bee venom   Review of Systems Review of Systems  Constitutional: Negative for chills and fever.  HENT: Positive for facial swelling. Negative for sore throat.   Respiratory: Positive for chest tightness (resolved). Negative for shortness of breath.   Cardiovascular: Negative for chest pain.  Gastrointestinal: Negative for abdominal pain, nausea and vomiting.  Genitourinary: Negative for dysuria.  Musculoskeletal: Negative for back pain.  Skin: Positive for rash. Negative for wound.  Neurological: Negative for headaches.  Psychiatric/Behavioral: The patient is not nervous/anxious.      Physical Exam Updated Vital Signs BP 128/76   Pulse 82   Temp 97.8 F (36.6 C) (Oral)   Resp 16   LMP 09/21/2013   SpO2 98%   Physical Exam Vitals signs and nursing note reviewed.  Constitutional:      General: She  is not in acute distress.    Appearance: She is well-developed. She is not diaphoretic.     Comments: Patient in no acute distress, lying on the stretcher comfortably  HENT:     Head: Normocephalic and atraumatic.     Comments: In comparison to baseline picture the patient showed me, there does appear to be some mild facial swelling; no significant swelling to the lips, tongue, posterior oropharynx    Mouth/Throat:     Pharynx: No oropharyngeal exudate.  Eyes:     General: No scleral icterus.       Right eye: No discharge.        Left eye: No discharge.     Conjunctiva/sclera: Conjunctivae normal.     Pupils: Pupils are equal, round, and reactive to light.  Neck:     Musculoskeletal: Normal range of motion and neck supple.     Thyroid: No thyromegaly.  Cardiovascular:     Rate and Rhythm: Normal rate and regular rhythm.     Heart sounds: Normal heart sounds. No murmur. No friction rub. No gallop.   Pulmonary:     Effort: Pulmonary effort is normal. No respiratory distress.     Breath sounds: Normal breath sounds. No stridor. No wheezing or rales.  Abdominal:     General: Bowel sounds are normal. There is no distension.     Palpations: Abdomen is soft.     Tenderness: There is no abdominal tenderness. There is no guarding or rebound.  Lymphadenopathy:     Cervical: No cervical adenopathy.  Skin:    General: Skin is warm and dry.     Coloration: Skin is not pale.     Findings: No rash.     Comments: No rash or swelling in the arms There may be a small area of dried blood or venom in the middle of the thoracic spine, however no associated swelling, redness, or induration  Neurological:     Mental Status: She is alert.     Coordination: Coordination normal.      ED Treatments / Results  Labs (all labs ordered are listed, but only abnormal results are displayed) Labs Reviewed - No data to display  EKG EKG Interpretation  Date/Time:  Tuesday January 12 2019 16:10:34 EDT  Ventricular Rate:  113 PR Interval:    QRS Duration: 95 QT Interval:  344 QTC Calculation: 472 R Axis:   43 Text Interpretation:  Sinus tachycardia Abnormal R-wave progression, early transition Since last tracing rate faster Confirmed by Linwood DibblesKnapp, Jon [id number]) on 01/12/2019 4:20:42 PM   Radiology No results found.  Procedures Procedures (including critical care time)  Medications Ordered in ED Medications  famotidine (PEPCID) tablet 40 mg (40 mg Oral Given  01/12/19 1738)  diphenhydrAMINE (BENADRYL) injection 50 mg (50 mg Intramuscular Given 01/12/19 1738)  dexamethasone (DECADRON) injection 10 mg (10 mg Intramuscular Given 01/12/19 1737)     Initial Impression / Assessment and Plan / ED Course  I have reviewed the triage vital signs and the nursing notes.  Pertinent labs & imaging results that were available during my care of the patient were reviewed by me and considered in my medical decision making (see chart for details).        Patient presenting with allergic reaction to bee sting.  Patient has no previously known allergies to bee venom.  Patient was already feeling better on arrival.  I recommend we give IV Solu-Medrol, Benadryl, Pepcid, however patient refused IV and states she was a hard stick.  She requested IM medications.  Decadron, Benadryl given IM and Pepcid orally.  Patient was monitored for a few hours and is feeling back to baseline.  Her facial swelling has improved and has no more sensation of swelling in her throat.  Patient will be discharged home with EpiPen, prednisone, Benadryl, Pepcid.  Patient counseled on reasons to return and when to use EpiPen.  She is also advised to put ice on the bee sting for the next few days.  Patient understands and agrees with plan.  Patient vital stable throughout ED course and discharged in satisfactory condition.  Final Clinical Impressions(s) / ED Diagnoses   Final diagnoses:  Allergic reaction, initial encounter    ED  Discharge Orders         Ordered    predniSONE (DELTASONE) 50 MG tablet  Daily with breakfast     01/12/19 1843    diphenhydrAMINE (BENADRYL) 25 MG tablet  Every 6 hours     01/12/19 1843    famotidine (PEPCID) 20 MG tablet  2 times daily     01/12/19 1843    EPINEPHrine (EPIPEN 2-PAK) 0.3 mg/0.3 mL IJ SOAJ injection  As needed     01/12/19 1843           Frederica Kuster, PA-C 01/12/19 1950    Dorie Rank, MD 01/13/19 [id number]

## 2019-01-12 NOTE — Discharge Instructions (Signed)
Take prednisone as prescribed for the next 5 days.  Take Benadryl every 6 hours for the next 24 hours.  Take Pepcid twice daily for 1 week.  If you develop any new or worsening symptoms, please return the emergency department.  If you develop swelling of your lips, tongue, throat, difficulty breathing in combination with any of the other symptoms following another bee sting and the directions below, use your EpiPen and proceed directly to the emergency department.

## 2019-11-25 IMAGING — CR CHEST - 2 VIEW
2 series · 2 of 2 positions shown · non-contrast
Comparison: December 06, 2015

CLINICAL DATA: Soreness under left shoulder for several days.

EXAM:
CHEST - 2 VIEW

[w chest pa]
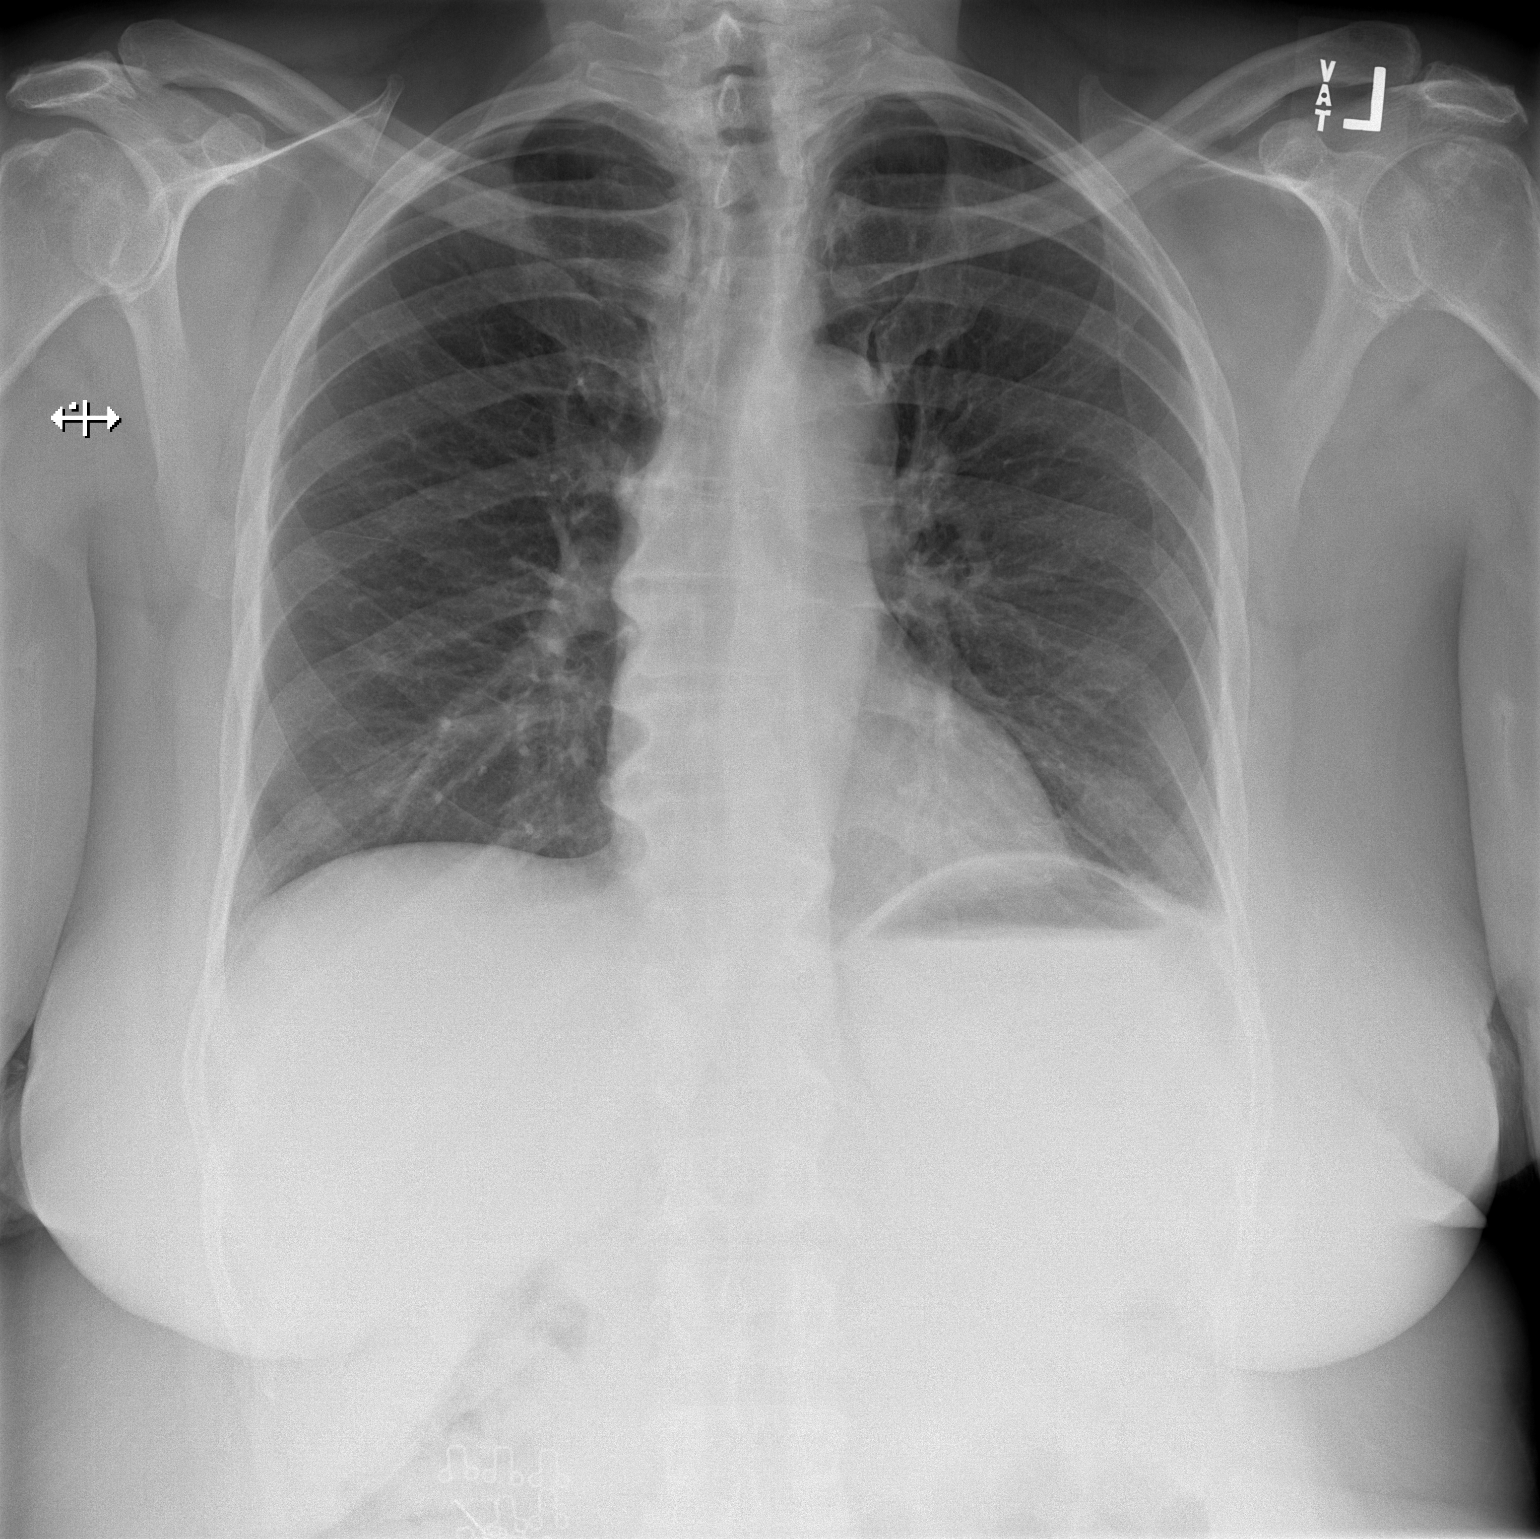

[w chest lat]
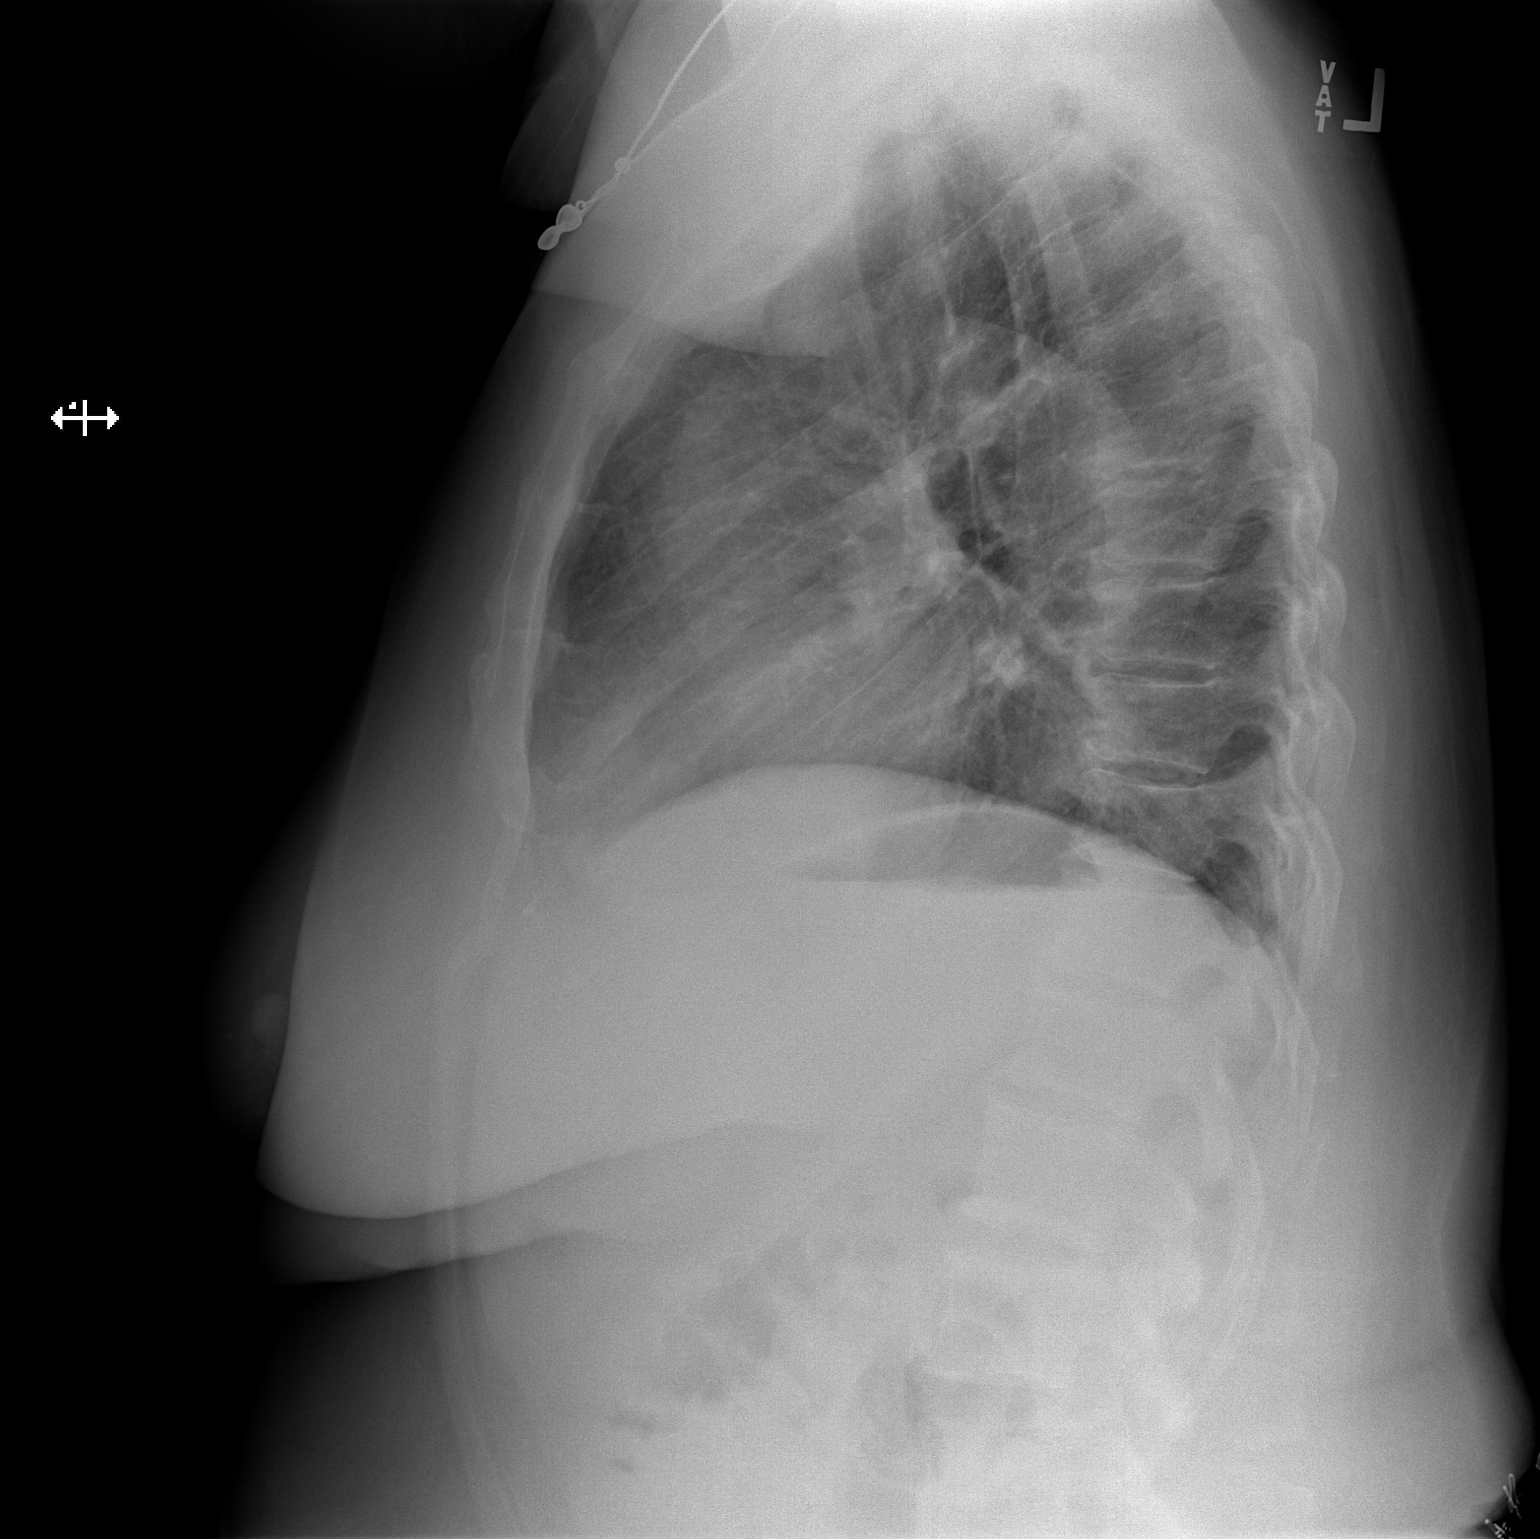

[2 of 2 positions shown; findings below may reference images not displayed]

FINDINGS: There is a tiny left pleural effusion with blunting of left
costophrenic angle. The heart, hila, mediastinum, lungs, and pleura
are otherwise unremarkable.
IMPRESSION: Tiny left pleural effusion.  No other abnormalities.

## 2021-02-22 ENCOUNTER — Emergency Department (HOSPITAL_COMMUNITY)
Admission: EM | Admit: 2021-02-22 | Discharge: 2021-02-22 | Disposition: A | Discharge status: Home / Self Care | Payer: Self-pay | Attending: Emergency Medicine | Admitting: Emergency Medicine

## 2021-02-22 ENCOUNTER — Other Ambulatory Visit: Payer: Self-pay

## 2021-02-22 ENCOUNTER — Encounter (HOSPITAL_COMMUNITY): Payer: Self-pay | Admitting: *Deleted

## 2021-02-22 DIAGNOSIS — R531 Weakness: Secondary | ICD-10-CM | POA: Insufficient documentation

## 2021-02-22 DIAGNOSIS — R42 Dizziness and giddiness: Secondary | ICD-10-CM | POA: Insufficient documentation

## 2021-02-22 DIAGNOSIS — I10 Essential (primary) hypertension: Secondary | ICD-10-CM | POA: Insufficient documentation

## 2021-02-22 DIAGNOSIS — U071 COVID-19: Secondary | ICD-10-CM | POA: Insufficient documentation

## 2021-02-22 DIAGNOSIS — Z87891 Personal history of nicotine dependence: Secondary | ICD-10-CM | POA: Insufficient documentation

## 2021-02-22 DIAGNOSIS — J069 Acute upper respiratory infection, unspecified: Secondary | ICD-10-CM | POA: Insufficient documentation

## 2021-02-22 LAB — SARS CORONAVIRUS 2 (TAT 6-24 HRS): SARS Coronavirus 2: POSITIVE — AB

## 2021-02-22 NOTE — ED Triage Notes (Signed)
Pt complains of sore throat, headache, dizziness x 3 days. She reports symptoms have improved. She had negative home covid test 3 days ago, she would like another test today before returning to work.

## 2021-02-22 NOTE — ED Notes (Signed)
An After Visit Summary was printed and given to the patient. Discharge instructions given and no further questions at this time.  

## 2021-02-22 NOTE — ED Provider Notes (Signed)
[facility]-EMERGENCY DEPT Provider Note   CSN: [id number] Arrival date & time: 02/22/21  4098     History Chief Complaint  Patient presents with   Weakness    Elizabeth Gallagher is a 60 y.o. female was exposed to an ill child on Saturday and began to feel poorly 3 days ago with headache, sore throat, and intermittent episodes of lightheadedness.  States she is feeling much improved at this time but would like to be tested for COVID-19 so she may return to work.  She had negative home test the day her symptoms started, however wants to be sure.  She was vaccinated x 3 against COVID.   Personally reads patient medical records.  She is history of heart murmur from rheumatic fever.  She has not on any anticoagulation.  HPI     Past Medical History:  Diagnosis Date   Heart murmur    Rheumatic fever     There are no problems to display for this patient.   History reviewed. No pertinent surgical history.   OB History   No obstetric history on file.     Family History  Problem Relation Age of Onset   Kidney disease Mother     Social History   Tobacco Use   Smoking status: Former  Substance Use Topics   Alcohol use: No   Drug use: No    Home Medications Prior to Admission medications   Medication Sig Start Date End Date Taking? Authorizing Provider  Aspirin-Caffeine (BAYER BACK & BODY PAIN EX ST) 500-32.5 MG TABS Take 2 tablets by mouth daily as needed (pain).    [provider]  cephALEXin (KEFLEX) 500 MG capsule Take 1 capsule (500 mg total) by mouth 4 (four) times daily. Patient not taking: Reported on 10/03/2018 12/03/15   Trixie Dredge, PA-C  cyclobenzaprine (FLEXERIL) 10 MG tablet Take 1 tablet (10 mg total) by mouth 2 (two) times daily as needed for muscle spasms. 01/02/19   Elpidio Anis, PA-C  diphenhydrAMINE (BENADRYL) 25 MG tablet Take 1 tablet (25 mg total) by mouth every 6 (six) hours for 1 day. 01/12/19 01/13/19  Law, Waylan Boga, PA-C   EPINEPHrine (EPIPEN 2-PAK) 0.3 mg/0.3 mL IJ SOAJ injection Inject 0.3 mLs (0.3 mg total) into the muscle as needed for anaphylaxis. 01/12/19   Law, Waylan Boga, PA-C  famotidine (PEPCID) 20 MG tablet Take 1 tablet (20 mg total) by mouth 2 (two) times daily. 01/12/19   Law, Waylan Boga, PA-C  ibuprofen (ADVIL) 200 MG tablet Take 200 mg by mouth daily as needed for moderate pain.    [provider]  naproxen (NAPROSYN) 375 MG tablet Take 1 tablet (375 mg total) by mouth 2 (two) times daily. 01/02/19   Elpidio Anis, PA-C  predniSONE (DELTASONE) 50 MG tablet Take 1 tablet (50 mg total) by mouth daily with breakfast. 01/12/19   Law, Waylan Boga, PA-C    Allergies    Bee venom  Review of Systems   Review of Systems  Constitutional:  Positive for appetite change and fatigue. Negative for chills and fever.  HENT:  Positive for congestion and sore throat. Negative for trouble swallowing and voice change.   Eyes: Negative.   Respiratory: Negative.    Cardiovascular: Negative.   Gastrointestinal: Negative.   Genitourinary: Negative.   Musculoskeletal: Negative.   Skin: Negative.   Neurological:  Positive for weakness, light-headedness and headaches.   Physical Exam Updated Vital Signs BP (!) 151/79   Pulse 79  Temp 97.7 F (36.5 C) (Oral)   Resp 18   LMP 09/21/2013   SpO2 98%   Physical Exam Vitals and nursing note reviewed.  Constitutional:      Appearance: She is obese. She is not ill-appearing or toxic-appearing.  HENT:     Head: Normocephalic and atraumatic.     Nose: Nose normal. No congestion.     Mouth/Throat:     Mouth: Mucous membranes are moist.     Pharynx: Oropharynx is clear. No oropharyngeal exudate or posterior oropharyngeal erythema.  Eyes:     General: Lids are normal. Vision grossly intact.        Right eye: No discharge.        Left eye: No discharge.     Extraocular Movements: Extraocular movements intact.     Conjunctiva/sclera: Conjunctivae normal.      Pupils: Pupils are equal, round, and reactive to light.  Neck:     Trachea: Trachea and phonation normal.  Cardiovascular:     Rate and Rhythm: Normal rate and regular rhythm.     Pulses: Normal pulses.     Heart sounds: Normal heart sounds. No murmur heard. Pulmonary:     Effort: Pulmonary effort is normal. No tachypnea, bradypnea, accessory muscle usage, prolonged expiration or respiratory distress.     Breath sounds: Normal breath sounds. No wheezing or rales.  Chest:     Chest wall: No mass, lacerations, deformity, swelling, tenderness, crepitus or edema.  Abdominal:     General: Bowel sounds are normal. There is no distension.     Palpations: Abdomen is soft.     Tenderness: There is no abdominal tenderness. There is no right CVA tenderness, left CVA tenderness, guarding or rebound.  Musculoskeletal:        General: No deformity.     Cervical back: Normal range of motion and neck supple. No edema, rigidity or crepitus. No pain with movement, spinous process tenderness or muscular tenderness.     Right lower leg: No edema.     Left lower leg: No edema.  Lymphadenopathy:     Cervical: No cervical adenopathy.  Skin:    General: Skin is warm and dry.     Capillary Refill: Capillary refill takes less than 2 seconds.  Neurological:     General: No focal deficit present.     Mental Status: She is alert and oriented to person, place, and time. Mental status is at baseline.  Psychiatric:        Mood and Affect: Mood normal.    ED Results / Procedures / Treatments   Labs (all labs ordered are listed, but only abnormal results are displayed) Labs Reviewed  SARS CORONAVIRUS 2 (TAT 6-24 HRS)    EKG None  Radiology No results found.  Procedures Procedures   Medications Ordered in ED Medications - No data to display  ED Course  I have reviewed the triage vital signs and the nursing notes.  Pertinent labs & imaging results that were available during my care of the  patient were reviewed by me and considered in my medical decision making (see chart for details).  Clinical Course as of 02/22/21 1427  Thu Feb 22, 2021  1103 BP 156/105 at time of my exam.  [RS]    Clinical Course User Index [RS] Jenia Klepper, Eugene Gavia, PA-C   MDM Rules/Calculators/A&P  60 year old female presents with concern for sore throat, headache, and lightheadedness 3 days ago now improved.  Desiring COVID test.  Hypertensive on intake, time of my exam I reevaluated her blood pressure which is improved from intake value though remains hypertensive.  She is not on any medications every day for blood pressure.  Cardiopulmonary exam is normal abdominal exam is benign.  HEENT exam reveals absence of tonsils and was otherwise unremarkable.  No rash on skin exam.  Patient has been swabbed for respiratory pathogen panel.  No further work-up warranted at this time.  She may follow chest results in the MyChart app.  Fynn voiced understanding of her medical evaluation and treatment plan.  For questions answered to express as fashion.  Return precautions given.  Patient is well-appearing, stable, and appropriate for discharge at this time.  This chart was dictated using voice recognition software, Dragon. Despite the best efforts of this provider to proofread and correct errors, errors may still occur which can change documentation meaning.  Final Clinical Impression(s) / ED Diagnoses Final diagnoses:  Upper respiratory tract infection, unspecified type    Rx / DC Orders ED Discharge Orders     None        Paris Lore, PA-C 02/22/21 1427    Horton, Clabe Seal, DO 02/22/21 1551

## 2021-02-22 NOTE — Discharge Instructions (Addendum)
You are seen in the ER today for your sore throat and lightheadedness.  Physical exam and vital signs today were very reassuring.  You do have some high blood pressure, recommend follow-up with the clinic listed below for reevaluation of your blood pressure.  You have been tested for COVID-19.  These test results may be followed in your MyChart account.  Please see the instructions listed below.  Return to the ER if develop any difficulty breathing, nausea or vomiting does not stop, chest pain, if you pass out, or develop any other severe symptoms.

## 2023-11-05 ENCOUNTER — Ambulatory Visit (HOSPITAL_COMMUNITY)
Admission: EM | Admit: 2023-11-05 | Discharge: 2023-11-05 | Disposition: A | Discharge status: Home / Self Care | Payer: Worker's Compensation | Attending: Family Medicine | Admitting: Family Medicine

## 2023-11-05 ENCOUNTER — Other Ambulatory Visit: Payer: Self-pay

## 2023-11-05 ENCOUNTER — Encounter (HOSPITAL_COMMUNITY): Payer: Self-pay | Admitting: *Deleted

## 2023-11-05 DIAGNOSIS — M79621 Pain in right upper arm: Secondary | ICD-10-CM

## 2023-11-05 MED ORDER — MELOXICAM 15 MG PO TABS: 15.0000 mg | ORAL_TABLET | Freq: Every day | ORAL | 0 refills | Status: AC

## 2023-11-05 MED ORDER — HYDROCODONE-ACETAMINOPHEN 5-325 MG PO TABS: 1.0000 | ORAL_TABLET | Freq: Four times a day (QID) | ORAL | 0 refills | Status: AC | PRN

## 2023-11-05 NOTE — ED Triage Notes (Signed)
 PT DOB and FULL name confirmed .

## 2023-11-05 NOTE — ED Triage Notes (Signed)
 PT reports she was picking up a box at work and felt something tear in her RT shoulder. This afternoon.

## 2023-11-05 NOTE — Discharge Instructions (Signed)
 Be aware, you have been prescribed pain medications that may cause drowsiness. While taking this medication, do not take any other medications containing acetaminophen (Tylenol). Do not combine with alcohol or recreational drugs. Please do not drive, operate heavy machinery, or take part in activities that require making important decisions while on this medication as your judgement may be clouded.

## 2023-11-06 NOTE — ED Provider Notes (Signed)
 [facility]   [id number] 11/05/23 Arrival Time: 1922  ASSESSMENT & PLAN:  1. Pain in right upper arm    No indication for plain imaging at this time. Question strain vs biceps injury. No evidence of biceps tendon rupture.  Discharge Medication List as of 11/05/2023  8:12 PM     START taking these medications   Details  HYDROcodone -acetaminophen  (NORCO/VICODIN) 5-325 MG tablet Take 1 tablet by mouth every 6 (six) hours as needed for moderate pain (pain score 4-6) or severe pain (pain score 7-10)., Starting Wed 11/05/2023, Normal    meloxicam (MOBIC) 15 MG tablet Take 1 tablet (15 mg total) by mouth daily., Starting Wed 11/05/2023, Normal        Work note with light duty restrictions given. Encourage shoulder ROM.  Recommend:  Follow-up Information     [facility]   Specialty: Specialist Why: If worsening or failing to improve as anticipated. Contact information: [address] [place] [address] [phone]                [place] Controlled Substances Registry consulted for this patient. I feel the risk/benefit ratio today is favorable for proceeding with this prescription for a controlled substance. Medication sedation precautions given.  Reviewed expectations re: course of current medical issues. Questions answered. Outlined signs and symptoms indicating need for more acute intervention. Patient verbalized understanding. After Visit Summary given.  SUBJECTIVE: History from: patient. Elizabeth Gallagher is a 63 y.o. female who reports R upper arm pain s/p lifting object at work today.  "Felt like something tore in my arm, a sharp pain". Denies trauma. No extremity sensation changes or weakness. Pain has eased a bit but still significant.  History reviewed. No pertinent surgical history.    OBJECTIVE:  Vitals:   11/05/23 2001  BP: (!) 130/53  Pulse: 73  Resp: 20  Temp: 98.1 F (36.7 C)  SpO2: 97%    General appearance: alert; no  distress HEENT: [place]; AT Neck: supple with FROM Resp: unlabored respirations Extremities: RUE: warm with well perfused appearance; poorly localized moderate tenderness over right biceps distribution; no specific TTP at proximal biceps insertions; normal supination/pronation of RUE; without gross deformities; swelling: none; bruising: none; shoulder and elbow ROM: normal CV: brisk extremity capillary refill of RUE; 2+ radial pulse of RUE. Skin: warm and dry; no visible rashes Neurologic: gait normal; normal sensation and strength of RUE Psychological: alert and cooperative; normal mood and affect  Imaging: No results found.    Allergies  Allergen Reactions   Bee Venom     Past Medical History:  Diagnosis Date   Heart murmur    Rheumatic fever    Social History   Socioeconomic History   Marital status: Single    Spouse name: Not on file   Number of children: Not on file   Years of education: Not on file   Highest education level: Not on file  Occupational History   Not on file  Tobacco Use   Smoking status: Former   Smokeless tobacco: Not on file  Substance and Sexual Activity   Alcohol use: No   Drug use: No   Sexual activity: Not on file  Other Topics Concern   Not on file  Social History Narrative   Not on file   Social Drivers of Health   Financial Resource Strain: Not on file  Food Insecurity: Not on file  Transportation Needs: Not on file  Physical Activity: Not on file  Stress:  Not on file  Social Connections: Not on file   Family History  Problem Relation Age of Onset   Kidney disease Mother    History reviewed. No pertinent surgical history.     Afton Albright, MD 11/06/23 [phone]
